# Patient Record
Sex: Female | Born: 1995
Health system: Southern US, Community
[De-identification: ages and names within clinical notes are randomized; demographics above are authoritative.]

## PROBLEM LIST (undated history)

## (undated) DIAGNOSIS — J302 Other seasonal allergic rhinitis: Secondary | ICD-10-CM

## (undated) DIAGNOSIS — L309 Dermatitis, unspecified: Secondary | ICD-10-CM

## (undated) DIAGNOSIS — T4145XA Adverse effect of unspecified anesthetic, initial encounter: Secondary | ICD-10-CM

## (undated) DIAGNOSIS — N926 Irregular menstruation, unspecified: Secondary | ICD-10-CM

## (undated) DIAGNOSIS — T8859XA Other complications of anesthesia, initial encounter: Secondary | ICD-10-CM

## (undated) DIAGNOSIS — J353 Hypertrophy of tonsils with hypertrophy of adenoids: Secondary | ICD-10-CM

## (undated) HISTORY — PX: MOUTH SURGERY: SHX715

---
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
## 2017-05-29 ENCOUNTER — Other Ambulatory Visit: Payer: Self-pay

## 2017-05-29 ENCOUNTER — Encounter (HOSPITAL_COMMUNITY): Payer: Self-pay

## 2017-05-29 ENCOUNTER — Emergency Department (HOSPITAL_COMMUNITY): Payer: BLUE CROSS/BLUE SHIELD

## 2017-05-29 ENCOUNTER — Emergency Department (HOSPITAL_COMMUNITY)
Admission: EM | Admit: 2017-05-29 | Discharge: 2017-05-29 | Disposition: A | Discharge status: Home / Self Care | Payer: BLUE CROSS/BLUE SHIELD | Attending: Emergency Medicine | Admitting: Emergency Medicine

## 2017-05-29 DIAGNOSIS — S301XXA Contusion of abdominal wall, initial encounter: Secondary | ICD-10-CM | POA: Insufficient documentation

## 2017-05-29 DIAGNOSIS — Z7722 Contact with and (suspected) exposure to environmental tobacco smoke (acute) (chronic): Secondary | ICD-10-CM | POA: Diagnosis not present

## 2017-05-29 DIAGNOSIS — T07XXXA Unspecified multiple injuries, initial encounter: Secondary | ICD-10-CM

## 2017-05-29 DIAGNOSIS — Y92410 Unspecified street and highway as the place of occurrence of the external cause: Secondary | ICD-10-CM | POA: Diagnosis not present

## 2017-05-29 DIAGNOSIS — I871 Compression of vein: Secondary | ICD-10-CM | POA: Diagnosis not present

## 2017-05-29 DIAGNOSIS — Y9389 Activity, other specified: Secondary | ICD-10-CM | POA: Insufficient documentation

## 2017-05-29 DIAGNOSIS — Y999 Unspecified external cause status: Secondary | ICD-10-CM | POA: Diagnosis not present

## 2017-05-29 DIAGNOSIS — J302 Other seasonal allergic rhinitis: Secondary | ICD-10-CM | POA: Insufficient documentation

## 2017-05-29 DIAGNOSIS — Z789 Other specified health status: Secondary | ICD-10-CM

## 2017-05-29 LAB — COMPREHENSIVE METABOLIC PANEL
ALK PHOS: 56 U/L (ref 38–126)
ALT: 13 U/L — AB (ref 14–54)
ANION GAP: 10 (ref 5–15)
AST: 23 U/L (ref 15–41)
Albumin: 4.1 g/dL (ref 3.5–5.0)
BUN: 13 mg/dL (ref 6–20)
CALCIUM: 9.6 mg/dL (ref 8.9–10.3)
CO2: 23 mmol/L (ref 22–32)
CREATININE: 0.77 mg/dL (ref 0.44–1.00)
Chloride: 107 mmol/L (ref 101–111)
Glucose, Bld: 105 mg/dL — ABNORMAL HIGH (ref 65–99)
Potassium: 3.9 mmol/L (ref 3.5–5.1)
Sodium: 140 mmol/L (ref 135–145)
Total Bilirubin: 0.9 mg/dL (ref 0.3–1.2)
Total Protein: 7.1 g/dL (ref 6.5–8.1)

## 2017-05-29 LAB — I-STAT BETA HCG BLOOD, ED (MC, WL, AP ONLY): I-stat hCG, quantitative: <5 m[IU]/mL (ref <5)

## 2017-05-29 LAB — URINALYSIS, ROUTINE W REFLEX MICROSCOPIC
BILIRUBIN URINE: NEGATIVE
GLUCOSE, UA: NEGATIVE mg/dL
KETONES UR: NEGATIVE mg/dL
NITRITE: NEGATIVE
PROTEIN: NEGATIVE mg/dL
Specific Gravity, Urine: >1.046 — ABNORMAL HIGH (ref 1.005–1.030)
pH: 5 (ref 5.0–8.0)

## 2017-05-29 LAB — CBC
HCT: 39.8 % (ref 36.0–46.0)
HEMOGLOBIN: 12.9 g/dL (ref 12.0–15.0)
MCH: 29.9 pg (ref 26.0–34.0)
MCHC: 32.4 g/dL (ref 30.0–36.0)
MCV: 92.1 fL (ref 78.0–100.0)
PLATELETS: 236 10*3/uL (ref 150–400)
RBC: 4.32 MIL/uL (ref 3.87–5.11)
RDW: 12 % (ref 11.5–15.5)
WBC: 11.9 10*3/uL — AB (ref 4.0–10.5)

## 2017-05-29 LAB — I-STAT CHEM 8, ED
BUN: 13 mg/dL (ref 6–20)
CALCIUM ION: 1.27 mmol/L (ref 1.15–1.40)
CHLORIDE: 106 mmol/L (ref 101–111)
CREATININE: 0.7 mg/dL (ref 0.44–1.00)
Glucose, Bld: 102 mg/dL — ABNORMAL HIGH (ref 65–99)
HCT: 40 % (ref 36.0–46.0)
HEMOGLOBIN: 13.6 g/dL (ref 12.0–15.0)
POTASSIUM: 3.8 mmol/L (ref 3.5–5.1)
Sodium: 142 mmol/L (ref 135–145)
TCO2: 23 mmol/L (ref 22–32)

## 2017-05-29 LAB — SAMPLE TO BLOOD BANK

## 2017-05-29 LAB — I-STAT CG4 LACTIC ACID, ED: Lactic Acid, Venous: 2.15 mmol/L (ref 0.5–1.9)

## 2017-05-29 MED ORDER — BACLOFEN 10 MG PO TABS: 10.0000 mg | ORAL_TABLET | Freq: Three times a day (TID) | ORAL | 0 refills | Status: DC

## 2017-05-29 MED ORDER — IOPAMIDOL (ISOVUE-300) INJECTION 61%
INTRAVENOUS | Status: AC
  Administered 2017-05-29: 100 mL
  Filled 2017-05-29: qty 100

## 2017-05-29 MED ORDER — SODIUM CHLORIDE 0.9 % IV BOLUS
1000.0000 mL | Freq: Once | INTRAVENOUS | Status: AC
  Administered 2017-05-29: 1000 mL via INTRAVENOUS

## 2017-05-29 MED ORDER — SODIUM CHLORIDE 0.9 % IV BOLUS
500.0000 mL | Freq: Once | INTRAVENOUS | Status: AC
  Administered 2017-05-29: 500 mL via INTRAVENOUS

## 2017-05-29 MED ORDER — MELOXICAM 15 MG PO TABS: 15.0000 mg | ORAL_TABLET | Freq: Every day | ORAL | 0 refills | Status: DC

## 2017-05-29 MED ORDER — TETANUS-DIPHTH-ACELL PERTUSSIS 5-2.5-18.5 LF-MCG/0.5 IM SUSP
0.5000 mL | Freq: Once | INTRAMUSCULAR | Status: AC
  Administered 2017-05-29: 0.5 mL via INTRAMUSCULAR
  Filled 2017-05-29: qty 0.5

## 2017-05-29 NOTE — ED Notes (Signed)
EDP at bedside  
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
## 2017-05-29 NOTE — ED Notes (Signed)
Patient transported to X-ray 
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
## 2017-05-29 NOTE — Discharge Instructions (Signed)
There was no blood in your urine today. Please follow up with vascular surgery. Return to the emergency department immediately if you develop any of the following symptoms: You have numbness, tingling, or weakness in the arms or legs. You develop severe headaches not relieved with medicine. You have severe neck pain, especially tenderness in the middle of the back of your neck. You have changes in bowel or bladder control. There is increasing pain in any area of the body. You have shortness of breath, light-headedness, dizziness, or fainting. You have chest pain. You feel sick to your stomach (nauseous), throw up (vomit), or sweat. You have increasing abdominal discomfort. There is blood in your urine, stool, or vomit. You have pain in your shoulder (shoulder strap areas). You feel your symptoms are getting worse.

## 2017-07-23 ENCOUNTER — Other Ambulatory Visit: Payer: Self-pay

## 2017-07-23 ENCOUNTER — Encounter: Payer: Self-pay | Admitting: Surgery

## 2017-07-23 ENCOUNTER — Ambulatory Visit: Payer: BLUE CROSS/BLUE SHIELD | Admitting: Surgery

## 2017-07-23 VITALS — BP 103/69 | HR 90 | Temp 98.0°F | Resp 14 | Ht 62.0 in | Wt 135.0 lb

## 2017-07-23 DIAGNOSIS — I871 Compression of vein: Secondary | ICD-10-CM | POA: Diagnosis not present
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
## 2017-08-13 ENCOUNTER — Other Ambulatory Visit: Payer: Self-pay

## 2017-08-13 DIAGNOSIS — I871 Compression of vein: Secondary | ICD-10-CM

## 2017-12-31 ENCOUNTER — Other Ambulatory Visit: Payer: Self-pay

## 2017-12-31 ENCOUNTER — Encounter (HOSPITAL_COMMUNITY): Payer: Self-pay | Admitting: *Deleted

## 2017-12-31 ENCOUNTER — Emergency Department (HOSPITAL_COMMUNITY)
Admission: EM | Admit: 2017-12-31 | Discharge: 2017-12-31 | Disposition: A | Discharge status: Home / Self Care | Payer: BLUE CROSS/BLUE SHIELD | Attending: Emergency Medicine | Admitting: Emergency Medicine

## 2017-12-31 DIAGNOSIS — Z5321 Procedure and treatment not carried out due to patient leaving prior to being seen by health care provider: Secondary | ICD-10-CM | POA: Diagnosis not present

## 2017-12-31 DIAGNOSIS — I871 Compression of vein: Secondary | ICD-10-CM

## 2017-12-31 LAB — URINALYSIS, ROUTINE W REFLEX MICROSCOPIC
Bacteria, UA: NONE SEEN
Bilirubin Urine: NEGATIVE
GLUCOSE, UA: NEGATIVE mg/dL
Ketones, ur: NEGATIVE mg/dL
Leukocytes, UA: NEGATIVE
NITRITE: NEGATIVE
PH: 7 (ref 5.0–8.0)
Protein, ur: NEGATIVE mg/dL
SPECIFIC GRAVITY, URINE: 1.003 — AB (ref 1.005–1.030)

## 2017-12-31 NOTE — ED Notes (Signed)
Pt left said she will get urine results later.

## 2017-12-31 NOTE — ED Triage Notes (Signed)
Pt reports being diagnosed with "nutcracker syndrome" and was sent to hospital for UA, to r/o blood in urine. No distress is noted.

## 2018-01-07 ENCOUNTER — Ambulatory Visit (HOSPITAL_COMMUNITY)
Admission: RE | Admit: 2018-01-07 | Discharge: 2018-01-07 | Disposition: A | Discharge status: Home / Self Care | Payer: BLUE CROSS/BLUE SHIELD | Source: Ambulatory Visit | Attending: Surgery | Admitting: Surgery

## 2018-01-07 ENCOUNTER — Ambulatory Visit: Payer: BLUE CROSS/BLUE SHIELD | Admitting: Surgery

## 2018-01-07 ENCOUNTER — Other Ambulatory Visit: Payer: Self-pay

## 2018-01-07 ENCOUNTER — Encounter: Payer: Self-pay | Admitting: Surgery

## 2018-01-07 VITALS — BP 92/63 | HR 70 | Temp 98.0°F | Resp 16 | Ht 62.0 in | Wt 127.0 lb

## 2018-01-07 DIAGNOSIS — I871 Compression of vein: Secondary | ICD-10-CM | POA: Diagnosis present
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
## 2018-02-06 DIAGNOSIS — I871 Compression of vein: Secondary | ICD-10-CM | POA: Insufficient documentation

## 2018-04-02 DIAGNOSIS — H02881 Meibomian gland dysfunction right upper eyelid: Secondary | ICD-10-CM | POA: Diagnosis not present

## 2018-05-06 DIAGNOSIS — Z30013 Encounter for initial prescription of injectable contraceptive: Secondary | ICD-10-CM | POA: Diagnosis not present

## 2018-07-31 DIAGNOSIS — Z3042 Encounter for surveillance of injectable contraceptive: Secondary | ICD-10-CM | POA: Diagnosis not present

## 2019-03-19 DIAGNOSIS — Z20822 Contact with and (suspected) exposure to covid-19: Secondary | ICD-10-CM | POA: Diagnosis not present

## 2019-03-21 ENCOUNTER — Ambulatory Visit: Payer: Medicaid Other | Attending: Internal Medicine

## 2019-03-21 ENCOUNTER — Other Ambulatory Visit: Payer: Self-pay

## 2019-03-21 DIAGNOSIS — Z20822 Contact with and (suspected) exposure to covid-19: Secondary | ICD-10-CM | POA: Diagnosis not present

## 2019-03-22 LAB — NOVEL CORONAVIRUS, NAA: SARS-CoV-2, NAA: NOT DETECTED

## 2019-03-24 ENCOUNTER — Other Ambulatory Visit: Payer: BLUE CROSS/BLUE SHIELD

## 2019-04-04 ENCOUNTER — Other Ambulatory Visit: Payer: Self-pay

## 2019-04-04 DIAGNOSIS — I871 Compression of vein: Secondary | ICD-10-CM

## 2019-04-07 ENCOUNTER — Other Ambulatory Visit: Payer: Self-pay

## 2019-04-07 ENCOUNTER — Ambulatory Visit (HOSPITAL_COMMUNITY)
Admission: RE | Admit: 2019-04-07 | Discharge: 2019-04-07 | Disposition: A | Discharge status: Home / Self Care | Payer: BC Managed Care – PPO | Source: Ambulatory Visit | Attending: Surgery | Admitting: Surgery

## 2019-04-07 ENCOUNTER — Ambulatory Visit (INDEPENDENT_AMBULATORY_CARE_PROVIDER_SITE_OTHER): Payer: BC Managed Care – PPO | Admitting: Surgery

## 2019-04-07 ENCOUNTER — Encounter: Payer: Self-pay | Admitting: Surgery

## 2019-04-07 VITALS — BP 97/65 | HR 65 | Temp 97.4°F | Resp 18 | Ht 62.0 in | Wt 137.0 lb

## 2019-04-07 DIAGNOSIS — I871 Compression of vein: Secondary | ICD-10-CM | POA: Diagnosis not present
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
## 2019-04-08 DIAGNOSIS — Z3042 Encounter for surveillance of injectable contraceptive: Secondary | ICD-10-CM | POA: Diagnosis not present

## 2019-04-11 DIAGNOSIS — I871 Compression of vein: Secondary | ICD-10-CM | POA: Diagnosis not present

## 2019-04-12 LAB — URINALYSIS
Bilirubin Urine: NEGATIVE
Glucose, UA: NEGATIVE
Hgb urine dipstick: NEGATIVE
Ketones, ur: NEGATIVE
Nitrite: NEGATIVE
Specific Gravity, Urine: 1.023 (ref 1.001–1.03)
pH: 6 (ref 5.0–8.0)

## 2019-04-15 NOTE — Telephone Encounter (Signed)
Sent information to Dr Myra Gianotti in staff message

## 2019-04-16 ENCOUNTER — Other Ambulatory Visit: Payer: Self-pay | Admitting: *Deleted

## 2019-04-16 DIAGNOSIS — I871 Compression of vein: Secondary | ICD-10-CM

## 2019-04-16 NOTE — Telephone Encounter (Signed)
Received staff message from Dr Myra Gianotti requesting we order repeat urine and CT scan for 1 month. Lab order placed Larita Fife will schedule CT and notify patient regarding CT appt and lab information.

## 2019-04-28 ENCOUNTER — Other Ambulatory Visit: Payer: Self-pay

## 2019-04-28 DIAGNOSIS — I871 Compression of vein: Secondary | ICD-10-CM

## 2019-05-06 ENCOUNTER — Telehealth: Payer: Self-pay

## 2019-05-06 ENCOUNTER — Other Ambulatory Visit: Payer: Self-pay

## 2019-05-06 DIAGNOSIS — I871 Compression of vein: Secondary | ICD-10-CM

## 2019-05-06 NOTE — Telephone Encounter (Addendum)
Per Dr. Myra Gianotti, we will cancel the CTA for now with a repeat UA before her follow up appt 3/15.  He will decide if she needs a CT at that time.  Ernst Spell., LPN   ----- Message from Nada Libman, MD sent at 05/04/2019  8:59 PM EST ----- Regarding: RE: Peer to Peer Review Contact: [phone]  I will call  ----- Message ----- From: Yolonda Kida, LPN Sent: 7/80/0447   3:22 PM EST To: Nada Libman, MD Subject: Peer to Peer Review                            Hey Dr. Myra Gianotti.  This case is requiring a Peer to Peer review for diagnostic code i87.1 (Nutcracker Syndrome) and CPT code 15806 (CTA Abdomen/Pelvis).  The Representative said you can call [phone] and give them Reference # [id number].  This case will close out and be denied on 05/06/2019 if there is not a response.    Please advise if you are willing to call and start a Peer review.  Thank you,  Ernst Spell., LPN

## 2019-05-06 NOTE — Telephone Encounter (Addendum)
Pt called back.  She will have the Urinalysis tomorrow morning 05/07/2019.  She will follow up with Dr. Myra Gianotti on 05/19/19.  Ernst Spell., LPN    I tried calling patient to make her aware we are cancelling her CTA and that she will need a urinalysis.  No answer.  Voicemail left with instructions to return phone call.   Ernst Spell., LPN

## 2019-05-06 NOTE — Progress Notes (Signed)
Pt called back.  She will have the Urinalysis tomorrow morning 05/07/2019.  She will follow up with Dr. Myra Gianotti on 05/19/19.  Ernst Spell., LPN

## 2019-05-07 ENCOUNTER — Encounter: Payer: Self-pay | Admitting: Surgery

## 2019-05-07 DIAGNOSIS — I871 Compression of vein: Secondary | ICD-10-CM | POA: Diagnosis not present

## 2019-05-08 LAB — URINALYSIS
Bilirubin Urine: NEGATIVE
Glucose, UA: NEGATIVE
Hgb urine dipstick: NEGATIVE
Ketones, ur: NEGATIVE
Leukocytes,Ua: NEGATIVE
Nitrite: NEGATIVE
Protein, ur: NEGATIVE
Specific Gravity, Urine: 1.022 (ref 1.001–1.03)
pH: 6 (ref 5.0–8.0)

## 2019-05-09 ENCOUNTER — Other Ambulatory Visit: Payer: BC Managed Care – PPO

## 2019-05-16 ENCOUNTER — Telehealth (HOSPITAL_COMMUNITY): Payer: Self-pay

## 2019-05-16 NOTE — Telephone Encounter (Signed)

## 2019-05-19 ENCOUNTER — Other Ambulatory Visit: Payer: Self-pay

## 2019-05-19 ENCOUNTER — Ambulatory Visit (INDEPENDENT_AMBULATORY_CARE_PROVIDER_SITE_OTHER): Payer: BC Managed Care – PPO | Admitting: Surgery

## 2019-05-19 ENCOUNTER — Encounter: Payer: Self-pay | Admitting: Surgery

## 2019-05-19 VITALS — BP 104/66 | HR 89 | Temp 97.4°F | Resp 20 | Ht 62.0 in | Wt 138.0 lb

## 2019-05-19 DIAGNOSIS — I871 Compression of vein: Secondary | ICD-10-CM | POA: Diagnosis not present
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
## 2019-06-04 DIAGNOSIS — I871 Compression of vein: Secondary | ICD-10-CM | POA: Diagnosis not present

## 2019-07-16 DIAGNOSIS — Z6825 Body mass index (BMI) 25.0-25.9, adult: Secondary | ICD-10-CM | POA: Diagnosis not present

## 2019-07-16 DIAGNOSIS — Z3042 Encounter for surveillance of injectable contraceptive: Secondary | ICD-10-CM | POA: Diagnosis not present

## 2019-07-16 DIAGNOSIS — Z01419 Encounter for gynecological examination (general) (routine) without abnormal findings: Secondary | ICD-10-CM | POA: Diagnosis not present

## 2019-09-20 DIAGNOSIS — R0789 Other chest pain: Secondary | ICD-10-CM | POA: Diagnosis not present

## 2019-09-20 DIAGNOSIS — R002 Palpitations: Secondary | ICD-10-CM | POA: Diagnosis not present

## 2019-10-07 DIAGNOSIS — Z3042 Encounter for surveillance of injectable contraceptive: Secondary | ICD-10-CM | POA: Diagnosis not present

## 2019-12-15 DIAGNOSIS — M545 Low back pain, unspecified: Secondary | ICD-10-CM | POA: Diagnosis not present

## 2019-12-15 DIAGNOSIS — Z03818 Encounter for observation for suspected exposure to other biological agents ruled out: Secondary | ICD-10-CM | POA: Diagnosis not present

## 2019-12-22 DIAGNOSIS — R450 Nervousness: Secondary | ICD-10-CM | POA: Diagnosis not present

## 2019-12-22 DIAGNOSIS — F419 Anxiety disorder, unspecified: Secondary | ICD-10-CM | POA: Diagnosis not present

## 2019-12-22 DIAGNOSIS — Z793 Long term (current) use of hormonal contraceptives: Secondary | ICD-10-CM | POA: Diagnosis not present

## 2019-12-22 DIAGNOSIS — Z91048 Other nonmedicinal substance allergy status: Secondary | ICD-10-CM | POA: Diagnosis not present

## 2019-12-22 DIAGNOSIS — R109 Unspecified abdominal pain: Secondary | ICD-10-CM | POA: Diagnosis not present

## 2019-12-23 DIAGNOSIS — Z3042 Encounter for surveillance of injectable contraceptive: Secondary | ICD-10-CM | POA: Diagnosis not present

## 2020-02-10 DIAGNOSIS — Z01419 Encounter for gynecological examination (general) (routine) without abnormal findings: Secondary | ICD-10-CM | POA: Diagnosis not present

## 2020-02-10 DIAGNOSIS — Z118 Encounter for screening for other infectious and parasitic diseases: Secondary | ICD-10-CM | POA: Diagnosis not present

## 2020-07-06 ENCOUNTER — Emergency Department (HOSPITAL_COMMUNITY): Payer: 59

## 2020-07-06 ENCOUNTER — Other Ambulatory Visit: Payer: Self-pay

## 2020-07-06 ENCOUNTER — Emergency Department (HOSPITAL_COMMUNITY)
Admission: EM | Admit: 2020-07-06 | Discharge: 2020-07-06 | Disposition: A | Discharge status: Home / Self Care | Payer: 59 | Attending: Emergency Medicine | Admitting: Emergency Medicine

## 2020-07-06 DIAGNOSIS — R630 Anorexia: Secondary | ICD-10-CM | POA: Insufficient documentation

## 2020-07-06 DIAGNOSIS — Z7722 Contact with and (suspected) exposure to environmental tobacco smoke (acute) (chronic): Secondary | ICD-10-CM | POA: Insufficient documentation

## 2020-07-06 DIAGNOSIS — R1032 Left lower quadrant pain: Secondary | ICD-10-CM

## 2020-07-06 DIAGNOSIS — R197 Diarrhea, unspecified: Secondary | ICD-10-CM | POA: Insufficient documentation

## 2020-07-06 DIAGNOSIS — R110 Nausea: Secondary | ICD-10-CM | POA: Insufficient documentation

## 2020-07-06 LAB — URINALYSIS, ROUTINE W REFLEX MICROSCOPIC
Bilirubin Urine: NEGATIVE
Glucose, UA: NEGATIVE mg/dL
Ketones, ur: 20 mg/dL — AB
Leukocytes,Ua: NEGATIVE
Nitrite: NEGATIVE
Protein, ur: NEGATIVE mg/dL
Specific Gravity, Urine: >1.046 — ABNORMAL HIGH (ref 1.005–1.030)
pH: 5 (ref 5.0–8.0)

## 2020-07-06 LAB — CBC
HCT: 43.4 % (ref 36.0–46.0)
Hemoglobin: 14.4 g/dL (ref 12.0–15.0)
MCH: 30.5 pg (ref 26.0–34.0)
MCHC: 33.2 g/dL (ref 30.0–36.0)
MCV: 91.9 fL (ref 80.0–100.0)
Platelets: 233 10*3/uL (ref 150–400)
RBC: 4.72 MIL/uL (ref 3.87–5.11)
RDW: 11.3 % — ABNORMAL LOW (ref 11.5–15.5)
WBC: 4.9 10*3/uL (ref 4.0–10.5)
nRBC: 0 % (ref 0.0–0.2)

## 2020-07-06 LAB — COMPREHENSIVE METABOLIC PANEL
ALT: 12 U/L (ref 0–44)
AST: 23 U/L (ref 15–41)
Albumin: 4 g/dL (ref 3.5–5.0)
Alkaline Phosphatase: 50 U/L (ref 38–126)
Anion gap: 10 (ref 5–15)
BUN: 10 mg/dL (ref 6–20)
CO2: 21 mmol/L — ABNORMAL LOW (ref 22–32)
Calcium: 9 mg/dL (ref 8.9–10.3)
Chloride: 105 mmol/L (ref 98–111)
Creatinine, Ser: 0.79 mg/dL (ref 0.44–1.00)
GFR, Estimated: >60 mL/min (ref >60)
Glucose, Bld: 91 mg/dL (ref 70–99)
Potassium: 3.4 mmol/L — ABNORMAL LOW (ref 3.5–5.1)
Sodium: 136 mmol/L (ref 135–145)
Total Bilirubin: 0.7 mg/dL (ref 0.3–1.2)
Total Protein: 7.2 g/dL (ref 6.5–8.1)

## 2020-07-06 LAB — I-STAT BETA HCG BLOOD, ED (MC, WL, AP ONLY): I-stat hCG, quantitative: <5 m[IU]/mL (ref <5)

## 2020-07-06 LAB — LIPASE, BLOOD: Lipase: 26 U/L (ref 11–51)

## 2020-07-06 MED ORDER — ACETAMINOPHEN 325 MG PO TABS
650.0000 mg | ORAL_TABLET | Freq: Once | ORAL | Status: AC
  Administered 2020-07-06: 650 mg via ORAL
  Filled 2020-07-06: qty 2

## 2020-07-06 MED ORDER — IOHEXOL 300 MG/ML  SOLN
75.0000 mL | Freq: Once | INTRAMUSCULAR | Status: AC | PRN
  Administered 2020-07-06: 75 mL via INTRAVENOUS

## 2020-07-06 MED ORDER — ONDANSETRON 4 MG PO TBDP: 4.0000 mg | ORAL_TABLET | Freq: Three times a day (TID) | ORAL | 0 refills | Status: DC | PRN

## 2020-07-06 MED ORDER — ONDANSETRON HCL 4 MG/2ML IJ SOLN
4.0000 mg | Freq: Once | INTRAMUSCULAR | Status: AC
  Administered 2020-07-06: 4 mg via INTRAVENOUS
  Filled 2020-07-06: qty 2

## 2020-07-06 MED ORDER — SODIUM CHLORIDE 0.9 % IV BOLUS
1000.0000 mL | Freq: Once | INTRAVENOUS | Status: AC
  Administered 2020-07-06: 1000 mL via INTRAVENOUS

## 2020-07-06 NOTE — ED Provider Notes (Signed)
[facility] EMERGENCY DEPARTMENT Provider Note   CSN: [id number] Arrival date & time: 07/06/20  1016     History Chief Complaint  Patient presents with  . Abdominal Pain    Sarah Pierce is a 25 y.o. female with past medical history of irregular menstrual cycles that presents the emergency department today for left lower quadrant pain since Saturday. States that this started after she ate some "pink uncooked chicken." Patient states that left lower quadrant pain is severe, has had this since Saturday, describes it as a dull achy sensation that is constant.  Does not radiate anywhere.  Patient also reports multiple episodes of diarrhea, about 10 a day, loose and watery, no blood.  Also reports decreased appetite and nausea, states that she has not eaten since Saturday..  Denies any fevers, vomiting, chest pain, shortness of breath.  Denies any bloody stools.  Denies any abdominal history.  Denies any vaginal complaints, urinary problems.  Denies any sick contacts.  States this is never happened to her before.  HPI     Past Medical History:  Diagnosis Date  . Complication of anesthesia    woke up crying and shaking  . Eczema   . Irregular periods/menstrual cycles   . Seasonal allergies   . Tonsillar and adenoid hypertrophy 07/2016    There are no problems to display for this patient.   Past Surgical History:  Procedure Laterality Date  . MOUTH SURGERY    . TONSILLECTOMY AND ADENOIDECTOMY Bilateral 07/11/2016   Procedure: TONSILLECTOMY AND ADENOIDECTOMY;  Surgeon: Newman Pies, MD;  Location: [facility];  Service: ENT;  Laterality: Bilateral;     OB History   No obstetric history on file.     No family history on file.  Social History   Tobacco Use  . Smoking status: Passive Smoke Exposure - Never Smoker  . Smokeless tobacco: Never Used  . Tobacco comment: parents smoke inside sometimes, mostly outside  Vaping Use  . Vaping Use: Never used   Substance Use Topics  . Alcohol use: No  . Drug use: No    Home Medications Prior to Admission medications   Medication Sig Start Date End Date Taking? Authorizing Provider  medroxyPROGESTERone (DEPO-PROVERA) 150 MG/ML injection Inject 150 mg into the muscle every 3 (three) months.   Yes [provider]    Allergies    Soap  Review of Systems   Review of Systems  Constitutional: Negative for chills, diaphoresis, fatigue and fever.  HENT: Negative for congestion, sore throat and trouble swallowing.   Eyes: Negative for pain and visual disturbance.  Respiratory: Negative for cough, shortness of breath and wheezing.   Cardiovascular: Negative for chest pain, palpitations and leg swelling.  Gastrointestinal: Positive for abdominal pain, diarrhea and nausea. Negative for abdominal distention and vomiting.  Genitourinary: Negative for difficulty urinating.  Musculoskeletal: Negative for back pain, neck pain and neck stiffness.  Skin: Negative for pallor.  Neurological: Negative for dizziness, speech difficulty, weakness and headaches.  Psychiatric/Behavioral: Negative for confusion.    Physical Exam Updated Vital Signs BP 99/75   Pulse 75   Temp 98.3 F (36.8 C) (Oral)   Resp 20   LMP 06/15/2020   SpO2 100%   Physical Exam Constitutional:      General: She is not in acute distress.    Appearance: Normal appearance. She is not ill-appearing, toxic-appearing or diaphoretic.  HENT:     Mouth/Throat:     Mouth: Mucous membranes are  moist.     Pharynx: Oropharynx is clear.  Eyes:     General: No scleral icterus.    Extraocular Movements: Extraocular movements intact.     Pupils: Pupils are equal, round, and reactive to light.  Cardiovascular:     Rate and Rhythm: Normal rate and regular rhythm.     Pulses: Normal pulses.     Heart sounds: Normal heart sounds.  Pulmonary:     Effort: Pulmonary effort is normal. No respiratory distress.     Breath sounds: Normal  breath sounds. No stridor. No wheezing, rhonchi or rales.  Chest:     Chest wall: No tenderness.  Abdominal:     General: Abdomen is flat. There is no distension.     Palpations: Abdomen is soft.     Tenderness: There is abdominal tenderness in the left lower quadrant. There is no guarding or rebound. Negative signs include Murphy's sign, Rovsing's sign, McBurney's sign and psoas sign.  Genitourinary:    Comments: Deferred  Musculoskeletal:        General: No swelling or tenderness. Normal range of motion.     Cervical back: Normal range of motion and neck supple. No rigidity.     Right lower leg: No edema.     Left lower leg: No edema.  Skin:    General: Skin is warm and dry.     Capillary Refill: Capillary refill takes less than 2 seconds.     Coloration: Skin is not pale.  Neurological:     General: No focal deficit present.     Mental Status: She is alert and oriented to person, place, and time.  Psychiatric:        Mood and Affect: Mood normal.        Behavior: Behavior normal.     ED Results / Procedures / Treatments   Labs (all labs ordered are listed, but only abnormal results are displayed) Labs Reviewed  COMPREHENSIVE METABOLIC PANEL - Abnormal; Notable for the following components:      Result Value   Potassium 3.4 (*)    CO2 21 (*)    All other components within normal limits  CBC - Abnormal; Notable for the following components:   RDW 11.3 (*)    All other components within normal limits  LIPASE, BLOOD  URINALYSIS, ROUTINE W REFLEX MICROSCOPIC  I-STAT BETA HCG BLOOD, ED ([facility], [facility], [facility] ONLY)    EKG None  Radiology CT Abdomen Pelvis W Contrast  Result Date: 07/06/2020 CLINICAL DATA:  Left lower quadrant abdominal pain. Patient reports nutcracker syndrome. EXAM: CT ABDOMEN AND PELVIS WITH CONTRAST TECHNIQUE: Multidetector CT imaging of the abdomen and pelvis was performed using the standard protocol following bolus administration of intravenous contrast.  CONTRAST:  49mL OMNIPAQUE IOHEXOL 300 MG/ML  SOLN COMPARISON:  05/29/2017 FINDINGS: Lower chest: No acute abnormality. Hepatobiliary: No focal liver abnormality is seen. No gallstones, gallbladder wall thickening, or biliary dilatation. Pancreas: Unremarkable. No pancreatic ductal dilatation or surrounding inflammatory changes. Spleen: Normal in size without focal abnormality. Adrenals/Urinary Tract: Normal appearance of the adrenal glands. No kidney mass or hydronephrosis. Urinary bladder is unremarkable. Stomach/Bowel: The stomach appears normal. The appendix is visualized and appears within normal limits. No bowel wall thickening, inflammation, or distension identified. Vascular/Lymphatic: Dilatation of the left renal vein which is narrowed as it crosses under the superior mesenteric artery. No abdominopelvic adenopathy identified. Reproductive: Uterus and the adnexal structures are unremarkable. Other: There is a small volume of free fluid within the  right posterior pelvis on the order of 2.7 x 2.5 x 3.1 cm (volume = 11 cm^3), image 71/3. no focal fluid collections identified within the abdomen or pelvis. Musculoskeletal: No acute or significant osseous findings. IMPRESSION: 1. No acute findings identified within the abdomen or pelvis. 2. Small volume of free fluid within the right posterior pelvis is identified. Etiology is indeterminate, but is may be physiologic in a premenopausal female. 3. Dilatation of the left renal vein which is narrowed as it crosses under the superior mesenteric artery. This is consistent with nutcracker syndrome. Electronically Signed   By: Signa Kell M.D.   On: 07/06/2020 14:11    Procedures Procedures   Medications Ordered in ED Medications  sodium chloride 0.9 % bolus 1,000 mL (0 mLs Intravenous Stopped 07/06/20 1457)  ondansetron (ZOFRAN) injection 4 mg (4 mg Intravenous Given 07/06/20 1302)  acetaminophen (TYLENOL) tablet 650 mg (650 mg Oral Given 07/06/20 1302)  iohexol  (OMNIPAQUE) 300 MG/ML solution 75 mL (75 mLs Intravenous Contrast Given 07/06/20 1340)    ED Course  I have reviewed the triage vital signs and the nursing notes.  Pertinent labs & imaging results that were available during my care of the patient were reviewed by me and considered in my medical decision making (see chart for details).    MDM Rules/Calculators/A&P                         Azelie Schnackenberg is a 25 y.o. female with past medical history of irregular menstrual cycles that presents the emergency department today for left lower quadrant pain.  Patient is not in any acute distress, differential to include colitis, however does have tenderness to left lower quadrant, referred from urgent care for CT scan.  Low suspicion for pelvic pathology with diarrhea and symptom duration.  Also offered pelvic exam and [place] patient deferred at this time.  Work-up today unremarkable, CBC and CMP unremarkable.  Urinalysis shoes hgb, pt states that this is normal for her due to Nutcracker syndrome. No signs of infection.   CT scan does not show any acute abdominal or pelvic findings.  Incidental findings discussed with patient including free fluid on the right side which does not correlate with exam or patient's pain. Pt will follow up with PCP. Patient is aware that she has nutcracker syndrome. Upon reevaluation patient states that she feels better, passed p.o. challenge.  Patient most likely has gastroenteritis, to be discharged and follow-up with PCP in the next couple of days, symptomatic treatment discussed. States that she feels better than she has in many days.   Doubt need for further emergent work up at this time. I explained the diagnosis and have given explicit precautions to return to the ER including for any other new or worsening symptoms. The patient understands and accepts the medical plan as it's been dictated and I have answered their questions. Discharge instructions concerning home care and  prescriptions have been given. The patient is STABLE and is discharged to home in good condition.  Final Clinical Impression(s) / ED Diagnoses Final diagnoses:  Left lower quadrant abdominal pain    Rx / DC Orders ED Discharge Orders    None       Farrel Gordon, PA-C 07/06/20 1701    Little, Ambrose Finland, MD 07/09/20 [phone]

## 2020-07-06 NOTE — ED Notes (Signed)
Patient transported to CT 

## 2020-07-06 NOTE — Discharge Instructions (Signed)
  You were evaluated in the Emergency Department and after careful evaluation, we did not find any emergent condition requiring admission or further testing in the hospital.   Your exam/testing today was overall reassuring.  Symptoms seem to be due to food poisoning.  Please make sure to stay hydrated, follow-up with your PCP in the next couple of days and also use the attached instructions. Please return to the Emergency Department if you experience any worsening of your condition.  Thank you for allowing [place] to be a part of your care. Please speak to your pharmacist about any new medications prescribed today in regards to side effects or interactions with other medications.  CT below, please help with your PCP in regards to the incidental findings discussed.  IMPRESSION:  1. No acute findings identified within the abdomen or pelvis.  2. Small volume of free fluid within the right posterior pelvis is  identified. Etiology is indeterminate, but is may be physiologic in  a premenopausal female.  3. Dilatation of the left renal vein which is narrowed as it crosses  under the superior mesenteric artery. This is consistent with  nutcracker syndrome.

## 2020-07-06 NOTE — ED Triage Notes (Signed)
Pt reports since Saturday she started having abd pain.Pt reports LLQ. Pt reports decrease appetite. Pt reports diarrhea. Pt denies any sob,cp

## 2021-04-22 ENCOUNTER — Encounter: Payer: Self-pay | Admitting: Physician Assistant

## 2021-05-05 ENCOUNTER — Other Ambulatory Visit: Payer: 59

## 2021-05-05 ENCOUNTER — Ambulatory Visit (INDEPENDENT_AMBULATORY_CARE_PROVIDER_SITE_OTHER): Payer: 59 | Admitting: Physician Assistant

## 2021-05-05 ENCOUNTER — Encounter: Payer: Self-pay | Admitting: Physician Assistant

## 2021-05-05 ENCOUNTER — Other Ambulatory Visit (INDEPENDENT_AMBULATORY_CARE_PROVIDER_SITE_OTHER): Payer: 59

## 2021-05-05 VITALS — BP 122/84 | HR 103 | Ht 62.0 in | Wt 161.0 lb

## 2021-05-05 DIAGNOSIS — K5900 Constipation, unspecified: Secondary | ICD-10-CM

## 2021-05-05 DIAGNOSIS — R140 Abdominal distension (gaseous): Secondary | ICD-10-CM

## 2021-05-05 LAB — CBC WITH DIFFERENTIAL/PLATELET
Basophils Absolute: 0 10*3/uL (ref 0.0–0.1)
Basophils Relative: 0.3 % (ref 0.0–3.0)
Eosinophils Absolute: 0.1 10*3/uL (ref 0.0–0.7)
Eosinophils Relative: 1.8 % (ref 0.0–5.0)
HCT: 40.7 % (ref 36.0–46.0)
Hemoglobin: 13.9 g/dL (ref 12.0–15.0)
Lymphocytes Relative: 43.1 % (ref 12.0–46.0)
Lymphs Abs: 2.9 10*3/uL (ref 0.7–4.0)
MCHC: 34.1 g/dL (ref 30.0–36.0)
MCV: 90.7 fl (ref 78.0–100.0)
Monocytes Absolute: 0.4 10*3/uL (ref 0.1–1.0)
Monocytes Relative: 6.4 % (ref 3.0–12.0)
Neutro Abs: 3.3 10*3/uL (ref 1.4–7.7)
Neutrophils Relative %: 48.4 % (ref 43.0–77.0)
Platelets: 243 10*3/uL (ref 150.0–400.0)
RBC: 4.49 Mil/uL (ref 3.87–5.11)
RDW: 12 % (ref 11.5–15.5)
WBC: 6.7 10*3/uL (ref 4.0–10.5)

## 2021-05-05 LAB — SEDIMENTATION RATE: Sed Rate: 10 mm/hr (ref 0–20)

## 2021-05-05 LAB — C-REACTIVE PROTEIN: CRP: <1 mg/dL (ref 0.5–20.0)

## 2021-05-05 NOTE — Progress Notes (Signed)
? ?Subjective:  ? ? Patient ID: Sarah Pierce, female    DOB: January 08, 1996, 26 y.o.   MRN: [id number] ? ?HPI ?Genecis is a pleasant 26 year old white female, new to GI today, referred by Dr. Bufford Lope for evaluation of constipation, abdominal bloating and gas. ?Patient says that she had not had any GI problems and always had regular bowel movements until she contracted COVID-19 in July 2022.  She did not have any GI symptoms with the illness itself but afterwards developed abdominal cramping and other GI symptoms which have persisted. ?She has had pelvic ultrasounds which are unremarkable.  She says she is mostly having issues with constipation and has gone as long as 9 days without a bowel movement.  She is not currently taking any over-the-counter laxatives or other remedies for her GI symptoms.  She says her work schedule is irregular which makes it difficult for her to know how she would do if she takes a laxative.  No melena or hematochezia.  She has gained about 30 pounds for unclear reasons, does not feel that she has had significant change in activity or her diet.  She did have a recent TSH done which she says was normal.  Appetite has been fine, no complaints of heartburn or indigestion, no nausea or vomiting.  Occasionally she will have an episode of diarrhea and may have several diarrheal stools in 1 day and then goes back to being constipated. ? ?She did have CT of the abdomen and pelvis in May 2022 which was negative.  Specifically no gallstones, she was noted to have some dilation of the left renal vein as it crosses the SMA.  Otherwise negative study. ? ?Family history is negative for GI disease as far she is aware. ? ?Review of Systems Pertinent positive and negative review of systems were noted in the above HPI section.  All other review of systems was otherwise negative.  ? ?Outpatient Encounter Medications as of 05/05/2021  ?Medication Sig  ? medroxyPROGESTERone (DEPO-PROVERA) 150 MG/ML injection  Inject 150 mg into the muscle every 3 (three) months.  ? [DISCONTINUED] ondansetron (ZOFRAN ODT) 4 MG disintegrating tablet Take 1 tablet (4 mg total) by mouth every 8 (eight) hours as needed for nausea or vomiting.  ? ?No facility-administered encounter medications on file as of 05/05/2021.  ? ?Allergies  ?Allergen Reactions  ? Soap Rash and Other (See Comments)  ?  EXACERBATES ECZEMA ?  ? ?There are no problems to display for this patient. ? ?Social History  ? ?Socioeconomic History  ? Marital status: Single  ?  Spouse name: Not on file  ? Number of children: Not on file  ? Years of education: Not on file  ? Highest education level: Not on file  ?Occupational History  ? Not on file  ?Tobacco Use  ? Smoking status: Never  ?  Passive exposure: Yes  ? Smokeless tobacco: Never  ? Tobacco comments:  ?  parents smoke inside sometimes, mostly outside  ?Vaping Use  ? Vaping Use: Never used  ?Substance and Sexual Activity  ? Alcohol use: Yes  ?  Comment: occasional  ? Drug use: No  ? Sexual activity: Not Currently  ?Other Topics Concern  ? Not on file  ?Social History Narrative  ? Not on file  ? ?Social Determinants of Health  ? ?Financial Resource Strain: Not on file  ?Food Insecurity: Not on file  ?Transportation Needs: Not on file  ?Physical Activity: Not on file  ?Stress: Not on file  ?  Social Connections: Not on file  ?Intimate Partner Violence: Not on file  ? ? ?Ms. Mixson's family history includes Irritable bowel syndrome in her mother and paternal grandmother; Kidney cancer in her father. ? ? ?   ?Objective:  ?  ?Vitals:  ? 05/05/21 1039  ?BP: 122/84  ?Pulse: (!) 103  ?SpO2: 98%  ? ? ?Physical Exam Well-developed well-nourished young white female in no acute distress.  Height, Weight, 161 BMI 29.4 ? ?HEENT; nontraumatic normocephalic, EOMI, PE R LA, sclera anicteric. ?Oropharynx; not examined today ?Neck; supple, no JVD ?Cardiovascular; regular rate and rhythm with S1-S2, no murmur rub or gallop ?Pulmonary; Clear  bilaterally ?Abdomen; soft, nontender, nondistended, no palpable mass or hepatosplenomegaly, bowel sounds are active ?Rectal; not done today ?Skin; benign exam, no jaundice rash or appreciable lesions ?Extremities; no clubbing cyanosis or edema skin warm and dry ?Neuro/Psych; alert and oriented x4, grossly nonfocal mood and affect appropriate  ? ? ? ?   ?Assessment & Plan:  ? ?#54 26 year old white female with 33-month history of constipation, abdominal bloating and gas, occasional discomfort.  Symptom onset after she had COVID-19 in July 2022.  She had not had any GI issues prior to that time. ? ?Obstipation has been significant at times going as long as 9 days between bowel movements, she is also having an occasional episode of diarrhea. ? ?CT of the abdomen pelvis May 2022 unremarkable for any acute GI abnormality, no inflammatory process, no gallstones ? ?Symptoms are most consistent with postinfectious IBS type picture after COVID-19 infection. ?Doubt IBD but consider, rule out celiac disease ? ?Plan; Check sed rate,, CRP, TTG and IgA ?Patient has been drinking quite a bit of water every day, encouraged to continue drinking at least 60 ounces of water per day ?Start high-fiber diet ?Start trial of MiraLAX 17 g in 8 ounces of water every day.  I advised her to try this over the next couple of weeks.  If she does not find MiraLAX effective she will call back and speak to my nurse and at that time would plan to start trial of Linzess. ?Patient will be established with Dr. Candis Schatz ? ?Ommie Degeorge S Deuntae Kocsis PA-C ?05/05/2021 ? ? ?Cc: Jettie Booze, NP ?  ?

## 2021-05-05 NOTE — Patient Instructions (Addendum)
If you are age 24 or younger, your body mass index should be between 19-25. Your Body mass index is 29.45 kg/m?Marland Kitchen If this is out of the aformentioned range listed, please consider follow up with your Primary Care Provider.  ?________________________________________________________ ? ?The [facility] providers would like to encourage you to use [facility] to communicate with providers for non-urgent requests or questions.  Due to long hold times on the telephone, sending your provider a message by [facility] may be a faster and more efficient way to get a response.  Please allow 48 business hours for a response.  Please remember that this is for non-urgent requests.  ?_______________________________________________________ ? ?Your provider has requested that you go to the basement level for lab work before leaving today. Press "B" on the elevator. The lab is located at the first door on the left as you exit the elevator. ? ?START Miralax 1 capful in 8 ounces of water or juice. ? ?Continue drinking at least 60 ounces of water daily ? ?Call the office and speak with Amy's nurse, Beth in 2 weeks if Miralax is not working well. ? ?Thank you for entrusting me with your care and choosing [facility]. ? ?Mike Gip, PA-C ?

## 2021-05-06 LAB — TISSUE TRANSGLUTAMINASE, IGA: (tTG) Ab, IgA: <1 U/mL

## 2021-05-06 LAB — IGA: Immunoglobulin A: 227 mg/dL (ref 47–310)

## 2021-05-09 NOTE — Progress Notes (Signed)
Agree with the assessment and plan as outlined by Amy Esterwood, PA-C.  Yafet Cline E. Taylor Spilde, MD  

## 2021-12-04 IMAGING — CT CT ABD-PELV W/ CM
2 of 4 series · 15 of 46 positions shown, 17 images · IV contrast (APPLIED)
Comparison: 05/29/2017

CLINICAL DATA: Left lower quadrant abdominal pain. Patient reports
nutcracker syndrome.

EXAM:
CT ABDOMEN AND PELVIS WITH CONTRAST
TECHNIQUE: Multidetector CT imaging of the abdomen and pelvis was performed
using the standard protocol following bolus administration of
intravenous contrast.
CONTRAST:  75mL OMNIPAQUE IOHEXOL 300 MG/ML  SOLN

[Series 3: abdomen 5.0 · axial · 0.67mm/px · z∈[+775,+1180]mm · 12 of 93 slices shown, 14 images]
[im 6/93  soft-tissue]
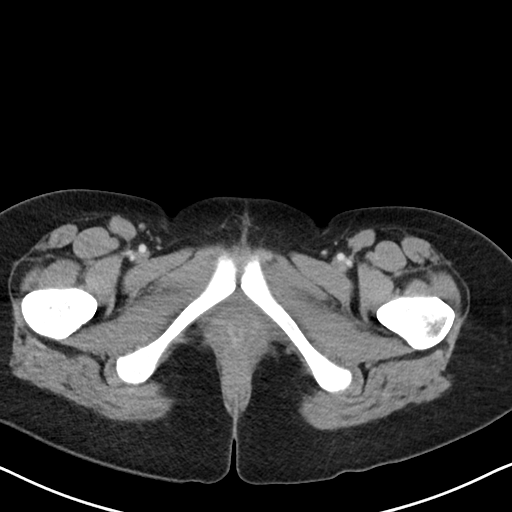
[im 6/93  bone]
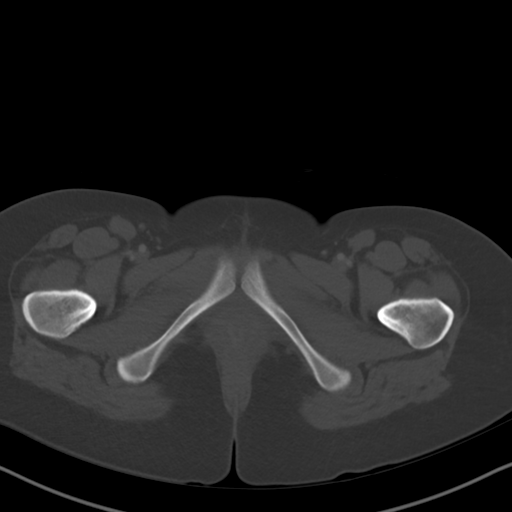
[im 16/93  soft-tissue]
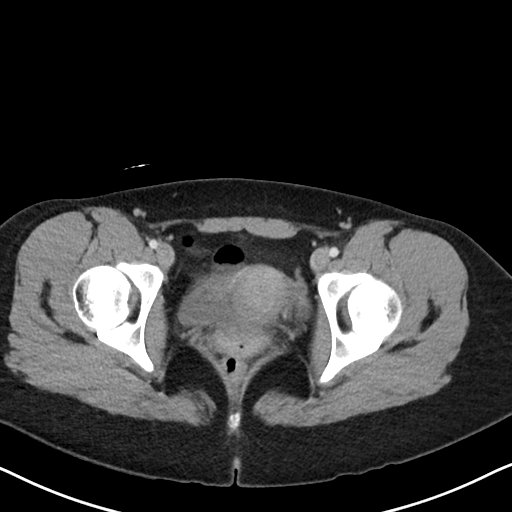
[im 21/93  soft-tissue]
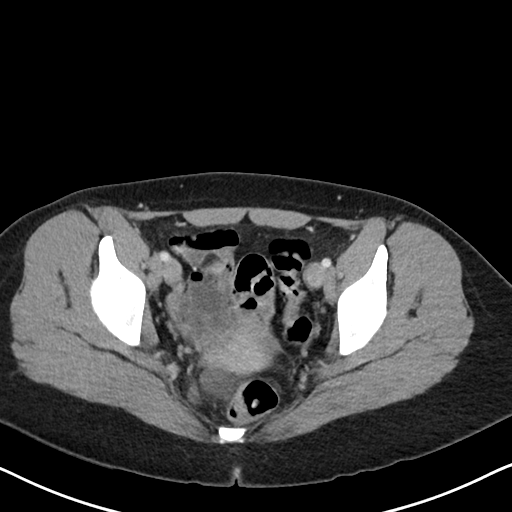
[im 26/93  soft-tissue]
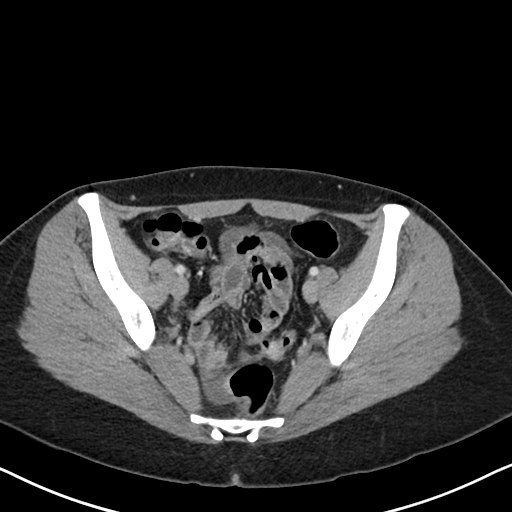
[im 36/93  soft-tissue]
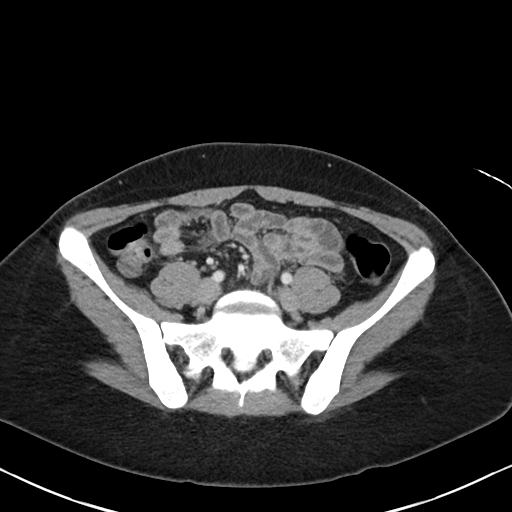
[im 41/93  soft-tissue]
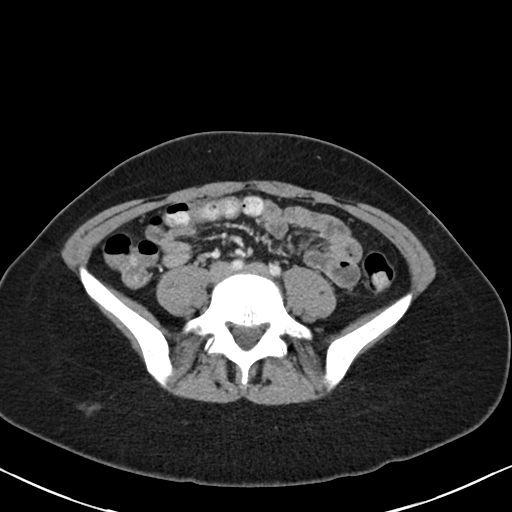
[im 52/93  soft-tissue]
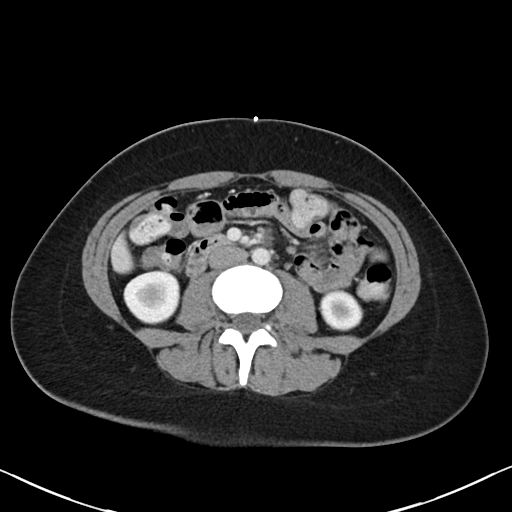
[im 57/93  soft-tissue]
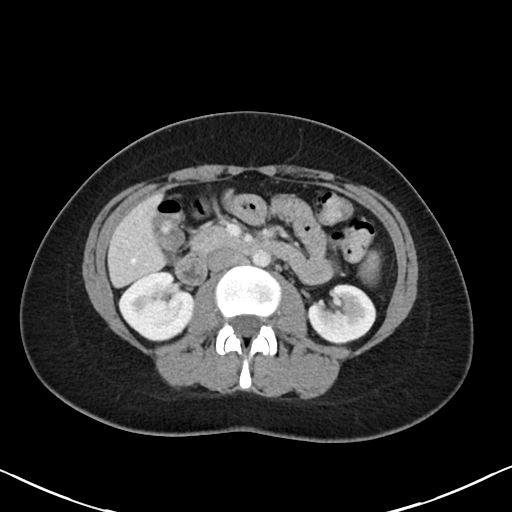
[im 67/93  soft-tissue]
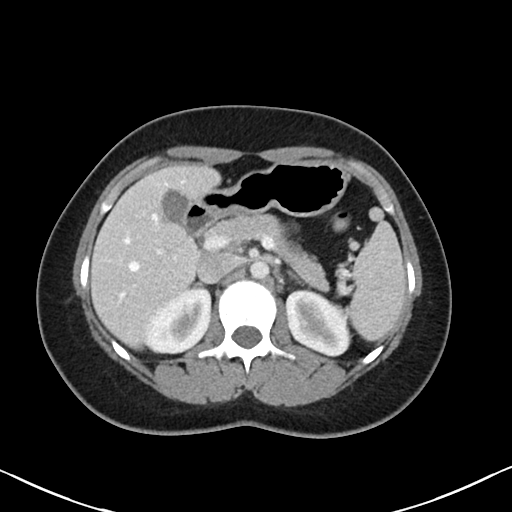
[im 67/93  bone]
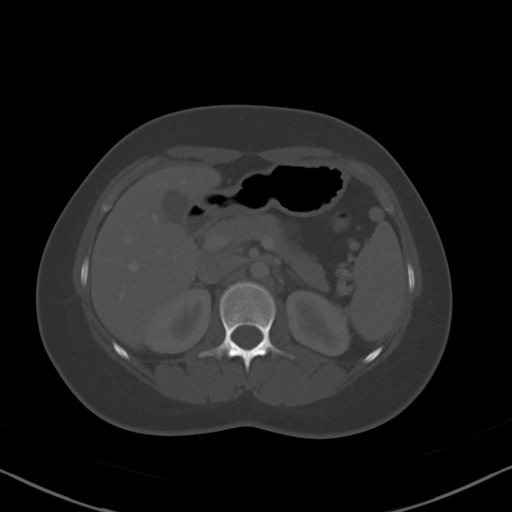
[im 72/93  soft-tissue]
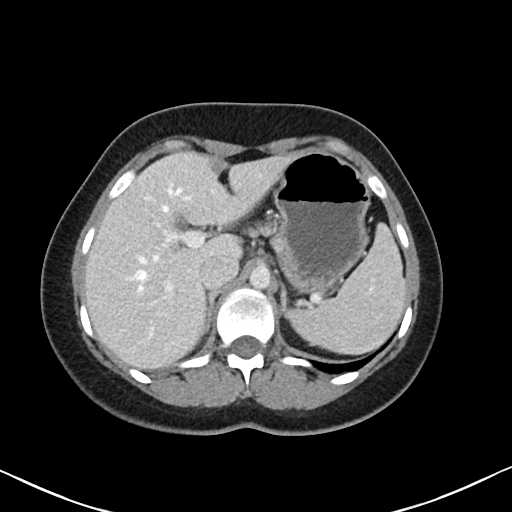
[im 77/93  soft-tissue]
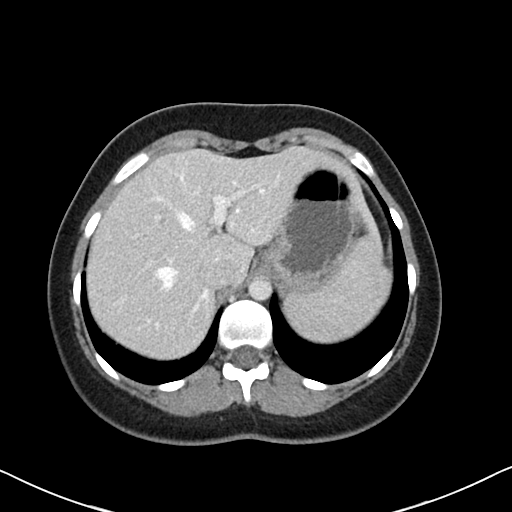
[im 87/93  soft-tissue]
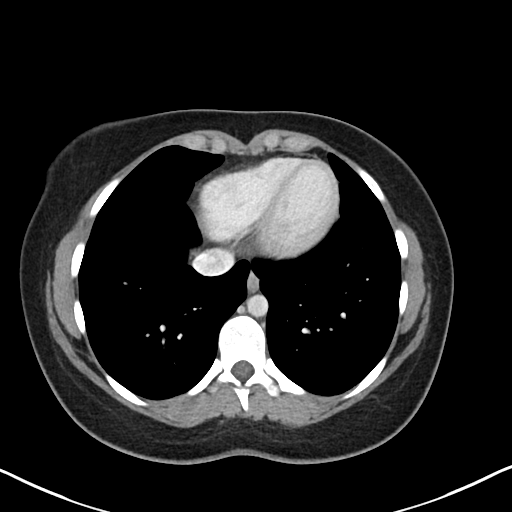

[Series 6: abdomen 3.0 mpr cor · coronal · 0.63mm/px · 3 of 88 slices shown]
[im 30/88  soft-tissue]
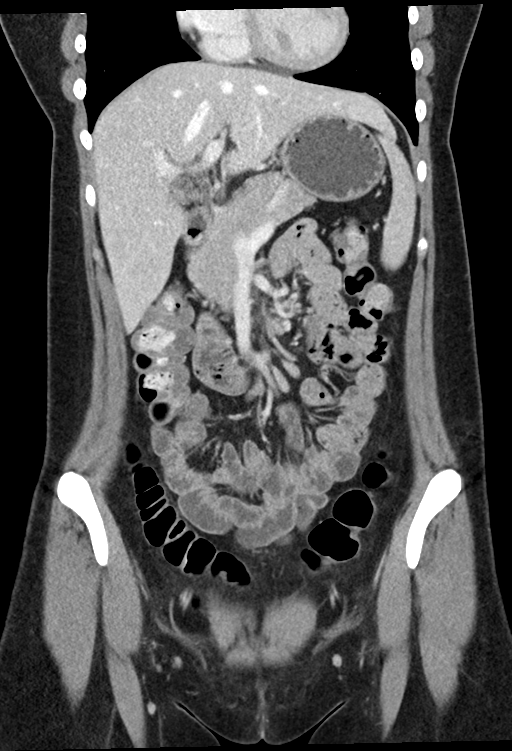
[im 39/88  soft-tissue]
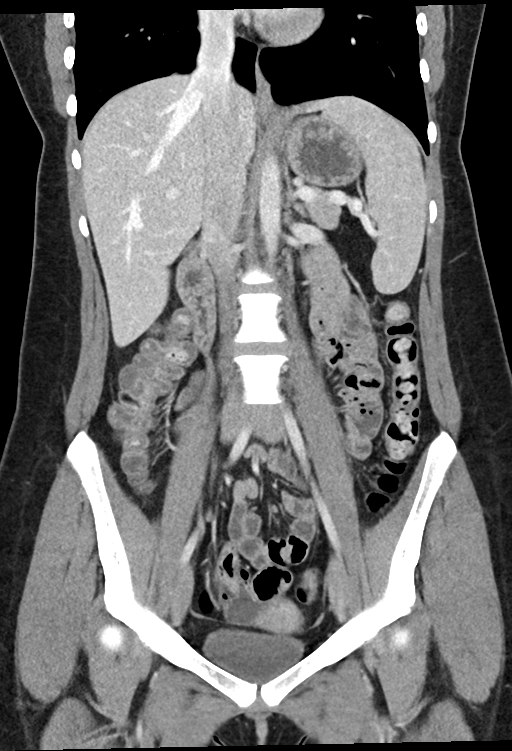
[im 49/88  soft-tissue]
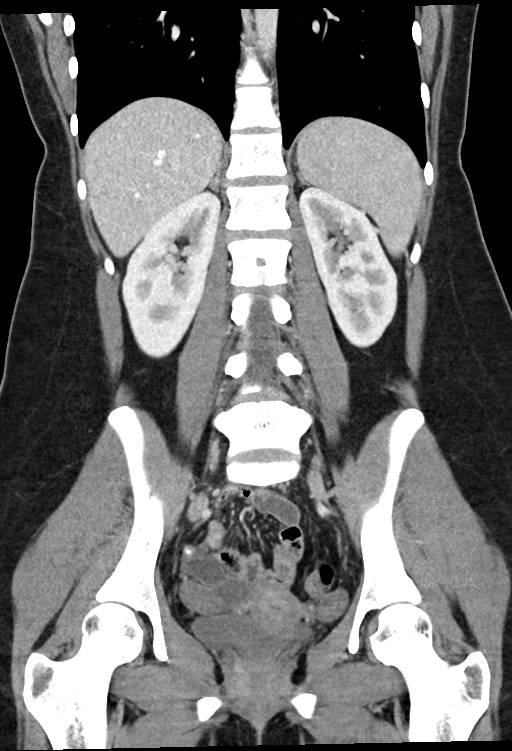

[15 of 46 positions shown; findings below may reference images not displayed]

FINDINGS: Lower chest: No acute abnormality.

Hepatobiliary: No focal liver abnormality is seen. No gallstones,
gallbladder wall thickening, or biliary dilatation.

Pancreas: Unremarkable. No pancreatic ductal dilatation or
surrounding inflammatory changes.

Spleen: Normal in size without focal abnormality.

Adrenals/Urinary Tract: Normal appearance of the adrenal glands. No
kidney mass or hydronephrosis. Urinary bladder is unremarkable.

Stomach/Bowel: The stomach appears normal. The appendix is
visualized and appears within normal limits. No bowel wall
thickening, inflammation, or distension identified.

Vascular/Lymphatic: Dilatation of the left renal vein which is
narrowed as it crosses under the superior mesenteric artery. No
abdominopelvic adenopathy identified.

Reproductive: Uterus and the adnexal structures are unremarkable.

Other: There is a small volume of free fluid within the right
posterior pelvis on the order of 2.7 x 2.5 x 3.1 cm (volume = 11
cm^3), image 71/3. no focal fluid collections identified within
the abdomen or pelvis.

Musculoskeletal: No acute or significant osseous findings.
IMPRESSION: 1. No acute findings identified within the abdomen or pelvis.
2. Small volume of free fluid within the right posterior pelvis is
identified. Etiology is indeterminate, but is may be physiologic in
a premenopausal female.
3. Dilatation of the left renal vein which is narrowed as it crosses
under the superior mesenteric artery. This is consistent with
nutcracker syndrome.

## 2022-12-05 ENCOUNTER — Encounter (HOSPITAL_BASED_OUTPATIENT_CLINIC_OR_DEPARTMENT_OTHER): Payer: Self-pay | Admitting: Emergency Medicine

## 2022-12-05 ENCOUNTER — Other Ambulatory Visit: Payer: Self-pay

## 2022-12-05 ENCOUNTER — Emergency Department (HOSPITAL_BASED_OUTPATIENT_CLINIC_OR_DEPARTMENT_OTHER): Payer: 59

## 2022-12-05 ENCOUNTER — Emergency Department (HOSPITAL_BASED_OUTPATIENT_CLINIC_OR_DEPARTMENT_OTHER)
Admission: EM | Admit: 2022-12-05 | Discharge: 2022-12-05 | Disposition: A | Discharge status: Home / Self Care | Payer: Worker's Compensation | Attending: Emergency Medicine | Admitting: Emergency Medicine

## 2022-12-05 DIAGNOSIS — W319XXA Contact with unspecified machinery, initial encounter: Secondary | ICD-10-CM | POA: Insufficient documentation

## 2022-12-05 DIAGNOSIS — S60221A Contusion of right hand, initial encounter: Secondary | ICD-10-CM | POA: Diagnosis present

## 2022-12-05 DIAGNOSIS — Y990 Civilian activity done for income or pay: Secondary | ICD-10-CM | POA: Diagnosis not present

## 2022-12-05 NOTE — ED Triage Notes (Signed)
Works as a Location manager ,right hand injury , index, middle and ring fingers smashed , present with ice back wrapped right hand . No bleeding she said .

## 2022-12-05 NOTE — ED Provider Notes (Signed)
[place] [facility] AT [facility] Provider Note   CSN: [id number] Arrival date & time: 12/05/22  1421     History  Chief Complaint  Patient presents with   Hand Injury    right    Helia Haese is a 27 y.o. female. Presenting for evaluation of a hand injury. 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 She reports her right second through fourth fingers were crushed. She had immediate pain to the affected fingers. Reports a tinging sensation. Denies difficulty moving the hand or fingers. Tetanus vaccine is up to date. Reports 7 out of 10 pain. No meds prior to arrival.    Hand Injury      Home Medications Prior to Admission medications   Medication Sig Start Date End Date Taking? Authorizing Provider  medroxyPROGESTERone (DEPO-PROVERA) 150 MG/ML injection Inject 150 mg into the muscle every 3 (three) months.    [provider]      Allergies    Soap    Review of Systems   Review of Systems  Musculoskeletal:  Positive for arthralgias.  All other systems reviewed and are negative.   Physical Exam Updated Vital Signs BP 126/75 (BP Location: Left Arm)   Pulse 100   Temp 99.1 F (37.3 C)   Resp 18   Wt 70.3 kg   SpO2 96%   BMI 28.35 kg/m  Physical Exam Vitals and nursing note reviewed.  Constitutional:      General: She is not in acute distress.    Appearance: Normal appearance. She is normal weight. She is not ill-appearing.  HENT:     Head: Normocephalic and atraumatic.  Pulmonary:     Effort: Pulmonary effort is normal. No respiratory distress.  Abdominal:     General: Abdomen is flat.  Musculoskeletal:        General: Normal range of motion.     Cervical back: Neck supple.     Comments: Full AROM of all digits of right hand. Capillary refill normal. Sensation intact. Nail plates intact. Grip strength 5/5. Small abrasion to palmar surface of 3rd digit overlying  proximal phalanx without active bleeding  Skin:    General: Skin is warm and dry.  Neurological:     Mental Status: She is alert and oriented to person, place, and time.  Psychiatric:        Mood and Affect: Mood normal.        Behavior: Behavior normal.     ED Results / Procedures / Treatments   Labs (all labs ordered are listed, but only abnormal results are displayed) Labs Reviewed - No data to display  EKG None  Radiology DG Hand Complete Right  Result Date: 12/05/2022 CLINICAL DATA:  Injury, fingers got stuck in machine. Pain primarily in the middle finger. EXAM: RIGHT HAND - COMPLETE 3+ VIEW COMPARISON:  None Available. FINDINGS: There is no evidence of fracture or dislocation. There is no evidence of arthropathy or other focal bone abnormality. Soft tissues are unremarkable. No radiopaque foreign body or soft tissue gas. IMPRESSION: Negative radiographs of the right hand. Electronically Signed   By: Narda Rutherford M.D.   On: 12/05/2022 15:57    Procedures Procedures    Medications Ordered in ED Medications - No data to display  ED Course/ Medical Decision Making/ A&P  Medical Decision Making Amount and/or Complexity of Data Reviewed Radiology: ordered.   This patient presents to the ED for concern of finger injury, this involves an extensive number of treatment options, and is a complaint that carries with it a high risk of complications and morbidity.  The differential diagnosis includes fracture, contusion, sprain  My initial workup includes imaging. Patient declined pain medication  Additional history obtained from: Nursing notes from this visit.  I ordered imaging studies including DG right hand I independently visualized and interpreted imaging which showed negative I agree with the radiologist interpretation  Afebrile, hemodynamically stable. 27 year old female presenting for right hand pain after suffering a crush  injury prior to arrival. Neurovascular status intact. Small amount of bruising of the 2nd and 3rd digits with abrasion of the 3rd digit. Imaging negative for fracture. Likely a contusion. She was given finger splints for comfort. She was encouraged to establish care and follow up within the next week. Stable at discharge.  At this time there does not appear to be any evidence of an acute emergency medical condition and the patient appears stable for discharge with appropriate outpatient follow up. Diagnosis was discussed with patient who verbalizes understanding of care plan and is agreeable to discharge. I have discussed return precautions with patient who verbalizes understanding. Patient encouraged to follow-up with their PCP within 1 week. All questions answered.  Note: Portions of this report may have been transcribed using voice recognition software. Every effort was made to ensure accuracy; however, inadvertent computerized transcription errors may still be present.        Final Clinical Impression(s) / ED Diagnoses Final diagnoses:  Contusion of right hand, initial encounter    Rx / DC Orders ED Discharge Orders     None         Michelle Piper, Cordelia Poche 12/05/22 1610    Alvira Monday, MD 12/06/22 1311

## 2022-12-05 NOTE — Discharge Instructions (Signed)
You have been seen today for your complaint of hand contusion Your imaging was negative. Your discharge medications include Alternate tylenol and ibuprofen for pain. You may alternate these every 4 hours. You may take up to 800 mg of ibuprofen at a time and up to 1000 mg of tylenol. Follow up with: a PCP of your choice within the next week for reevaluation Please seek immediate medical care if you develop any of the following symptoms: You have severe pain. Your hand or fingers become numb. Your hand or fingers turn pale, blue, or cold. You cannot move your hand or wrist. Your hand is warm to the touch. At this time there does not appear to be the presence of an emergent medical condition, however there is always the potential for conditions to change. Please read and follow the below instructions.  Do not take your medicine if  develop an itchy rash, swelling in your mouth or lips, or difficulty breathing; call 911 and seek immediate emergency medical attention if this occurs.  You may review your lab tests and imaging results in their entirety on your MyChart account.  Please discuss all results of fully with your primary care provider and other specialist at your follow-up visit.  Note: Portions of this text may have been transcribed using voice recognition software. Every effort was made to ensure accuracy; however, inadvertent computerized transcription errors may still be present.

## 2023-01-05 ENCOUNTER — Ambulatory Visit: Payer: 59 | Admitting: Family Medicine

## 2023-11-28 ENCOUNTER — Ambulatory Visit: Admitting: Family Medicine

## 2024-02-28 ENCOUNTER — Other Ambulatory Visit: Payer: Self-pay | Admitting: Medical Genetics

## 2024-03-05 ENCOUNTER — Other Ambulatory Visit (HOSPITAL_COMMUNITY)

## 2024-03-05 ENCOUNTER — Ambulatory Visit (HOSPITAL_BASED_OUTPATIENT_CLINIC_OR_DEPARTMENT_OTHER)

## 2024-03-05 ENCOUNTER — Encounter (HOSPITAL_BASED_OUTPATIENT_CLINIC_OR_DEPARTMENT_OTHER): Payer: Self-pay

## 2024-03-05 VITALS — BP 116/75 | HR 72 | Ht 62.0 in | Wt 175.6 lb

## 2024-03-05 DIAGNOSIS — Z124 Encounter for screening for malignant neoplasm of cervix: Secondary | ICD-10-CM | POA: Diagnosis not present

## 2024-03-05 DIAGNOSIS — Z7689 Persons encountering health services in other specified circumstances: Secondary | ICD-10-CM | POA: Diagnosis not present

## 2024-03-05 NOTE — Progress Notes (Signed)
 "   New Patient Office Visit  Subjective:   Sarah Pierce [birth year] 03/05/2024  Chief Complaint  Patient presents with   New Patient (Initial Visit)    Patient is here to get established with PCP. Wants to get a full evaluation. Denies any concerns for today's visit.     HPI: [name] presents today to establish care at [facility] Care and [facility] Medicine at [facility]. Introduced to publishing rights manager role and practice setting with verbalized understanding by patient.  All questions answered.   Last PCP: unsure Last annual physical: unsure Concerns: See below   Patient is wanting to get established and get a full physical with lab work. State she recently has had family members diagnosed with strokes, diabetes, and HTN and wants to make sure she gets checked out. Patient requesting a referral for an OBGYN as her current one is retired and she is due for an exam.   The following portions of the patient's history were reviewed and updated as appropriate: past medical history, past surgical history, family history, social history, allergies, medications, and problem list.   Patient Active Problem List   Diagnosis Date Noted   Nutcracker phenomenon of renal vein 02/06/2018   Past Medical History:  Diagnosis Date   Complication of anesthesia    woke up crying and shaking   Eczema    Irregular periods/menstrual cycles    Seasonal allergies    Tonsillar and adenoid hypertrophy 07/2016   Past Surgical History:  Procedure Laterality Date   MOUTH SURGERY     TONSILLECTOMY AND ADENOIDECTOMY Bilateral 07/11/2016   Procedure: TONSILLECTOMY AND ADENOIDECTOMY;  Surgeon: Karis Clunes, MD;  Location: [facility];  Service: ENT;  Laterality: Bilateral;   Family History  Problem Relation Age of Onset   Irritable bowel syndrome Mother    Stroke Mother    Diabetes Mother    Kidney cancer Father    Irritable bowel syndrome Paternal Grandmother    Diabetes  Other    Social History   Socioeconomic History   Marital status: Single    Spouse name: Not on file   Number of children: Not on file   Years of education: Not on file   Highest education level: Not on file  Occupational History   Not on file  Tobacco Use   Smoking status: Never    Passive exposure: Yes   Smokeless tobacco: Never   Tobacco comments:    parents smoke inside sometimes, mostly outside  Vaping Use   Vaping status: Every Day  Substance and Sexual Activity   Alcohol use: Yes    Comment: occasional   Drug use: No   Sexual activity: Not Currently  Other Topics Concern   Not on file  Social History Narrative   Not on file   Social Drivers of Health   Tobacco Use: Medium Risk (03/05/2024)   Patient History    Smoking Tobacco Use: Never    Smokeless Tobacco Use: Never    Passive Exposure: Yes  Financial Resource Strain: Low Risk (03/05/2024)   Overall Financial Resource Strain (CARDIA)    Difficulty of Paying Living Expenses: Not hard at all  Food Insecurity: No Food Insecurity (03/05/2024)   Epic    Worried About Radiation Protection Practitioner of Food in the Last Year: Never true    [employer] of Food in the Last Year: Never true  Transportation Needs: No Transportation Needs (03/05/2024)   Epic    Lack of Transportation (Medical):  No    Lack of Transportation (Non-Medical): No  Physical Activity: Inactive (03/05/2024)   Exercise Vital Sign    Days of Exercise per Week: 0 days    Minutes of Exercise per Session: 0 min  Stress: No Stress Concern Present (03/05/2024)   [employer] of Occupational Health - Occupational Stress Questionnaire    Feeling of Stress: Not at all  Social Connections: Socially Isolated (03/05/2024)   Social Connection and Isolation Panel    Frequency of Communication with Friends and Family: More than three times a week    Frequency of Social Gatherings with Friends and Family: More than three times a week    Attends Religious Services: Never     Database Administrator or Organizations: No    Attends Banker Meetings: Never    Marital Status: Never married  Intimate Partner Violence: Not At Risk (03/05/2024)   Epic    Fear of Current or Ex-Partner: No    Emotionally Abused: No    Physically Abused: No    Sexually Abused: No  Depression (PHQ2-9): Low Risk (03/05/2024)   Depression (PHQ2-9)    PHQ-2 Score: 0  Alcohol Screen: Low Risk (03/05/2024)   Alcohol Screen    Last Alcohol Screening Score (AUDIT): 2  Housing: Low Risk (03/05/2024)   Epic    Unable to Pay for Housing in the Last Year: No    Number of Times Moved in the Last Year: 0    Homeless in the Last Year: No  Utilities: Not At Risk (03/05/2024)   Epic    Threatened with loss of utilities: No  Health Literacy: Adequate Health Literacy (03/05/2024)   B1300 Health Literacy    Frequency of need for help with medical instructions: Never   Outpatient Medications Prior to Visit  Medication Sig Dispense Refill   norethindrone (MICRONOR) 0.35 MG tablet Take 1 tablet by mouth daily.     medroxyPROGESTERone  (DEPO-PROVERA ) 150 MG/ML injection Inject 150 mg into the muscle every 3 (three) months. (Patient not taking: Reported on 03/05/2024)     No facility-administered medications prior to visit.   Allergies[1]  ROS: A complete ROS was performed with pertinent positives/negatives noted in the HPI. The remainder of the ROS are negative.   Objective:   Today's Vitals   03/05/24 0911  BP: 116/75  Pulse: 72  SpO2: 100%  Weight: 175 lb 9.6 oz (79.7 kg)  Height: 5' 2 (1.575 m)    GENERAL: Well-appearing, in NAD. Well nourished. RESPIRATORY: Chest wall symmetrical. Respirations even and non-labored. Breath sounds clear to auscultation bilaterally.  CARDIAC: S1, S2 present, regular rate and rhythm without murmur or gallops. Peripheral pulses 2+ bilaterally.  MSK: Muscle tone and strength appropriate for age. Joints w/o tenderness, redness, or  swelling.  EXTREMITIES: Without clubbing, cyanosis, or edema.  NEUROLOGIC: No motor or sensory deficits. Steady, even gait. C2-C12 intact.  PSYCH/MENTAL STATUS: Alert, oriented x 3. Cooperative, appropriate mood and affect.    Health Maintenance Due  Topic Date Due   HPV VACCINES (1 - 3-dose series) Never done   HIV Screening  Never done   Hepatitis C Screening  Never done   Hepatitis B Vaccines 19-59 Average Risk (1 of 3 - 19+ 3-dose series) Never done   Cervical Cancer Screening (Pap smear)  02/09/2022   COVID-19 Vaccine (1 - 2025-26 season) Never done    No results found for any visits on 03/05/24.     Assessment & Plan:  1. Encounter  to establish care with new doctor (Primary) Discussed role of NP and expectations of the Primary Care Clinic. Discussed medical, surgical, and family history.   2. Encounter for Papanicolaou smear for cervical cancer screening Referral placed for patient. Discussed reaching out if anything concerns arise.  - Ambulatory referral to Obstetrics / Gynecology   Patient to reach out to office if new, worrisome, or unresolved symptoms arise or if no improvement in patient's condition. Patient verbalized understanding and is agreeable to treatment plan. All questions answered to patient's satisfaction.    Return in about 4 weeks (around 04/02/2024) for annual physical (fasting labs day of).    Lauraine Almarie Angus DNP, FNP-C     [1]  Allergies Allergen Reactions   Soap Rash and Other (See Comments)    EXACERBATES ECZEMA    "

## 2024-03-10 ENCOUNTER — Ambulatory Visit: Admitting: Family Medicine

## 2024-03-10 ENCOUNTER — Ambulatory Visit: Payer: Self-pay

## 2024-03-10 ENCOUNTER — Encounter: Payer: Self-pay | Admitting: Family Medicine

## 2024-03-10 VITALS — BP 100/68 | HR 100 | Temp 98.3°F | Resp 18 | Ht 62.0 in | Wt 177.0 lb

## 2024-03-10 DIAGNOSIS — H6011 Cellulitis of right external ear: Secondary | ICD-10-CM

## 2024-03-10 MED ORDER — CEPHALEXIN 500 MG PO CAPS: 500.0000 mg | ORAL_CAPSULE | Freq: Two times a day (BID) | ORAL | 0 refills | Status: AC

## 2024-03-10 NOTE — Telephone Encounter (Signed)
 FYI Only or Action Required?: FYI only for provider: appointment scheduled on 03/10/24.  Patient was last seen in primary care on 03/05/2024 by Sarah Lauraine BRAVO, FNP.  Called Nurse Triage reporting Otalgia.  Symptoms began several days ago.  Interventions attempted: OTC medications: ibuprofen and Ice/heat application.  Symptoms are: gradually worsening.  Triage Disposition: See HCP Within 4 Hours (Or PCP Triage)  Patient/caregiver understands and will follow disposition?: Yes     Copied from CRM #[id number]. Topic: Clinical - Red Word Triage >> Mar 10, 2024 10:13 AM Winona R wrote:  Pt believes her Ear pit is infected, lots of pain around ear and jaw. Pt states she has experienced this before.    Reason for Disposition  [1] SEVERE pain (e.g., excruciating) and [2] not improved 2 hours after pain medicine (e.g., acetaminophen  or ibuprofen)  Answer Assessment - Initial Assessment Questions Pt called to report R ear pain r/t suspected ear pit infection. Pt reports hx of ear pit infections but nothing that she has not been able to manage at home. Pt reports she is unable to express any fluid or pus from site; 10/10 throbbing pain around ear into jaw. Pt has applied warm compress and is taking ibuprofen for pain management. Pt denies fever, drainage, h/a, changes in hearing or balance. D/t appt availability, pt agreeable to go to sister office for eval. Appointment scheduled for evaluation. Patient agrees with plan of care, and will call back if anything changes, or if symptoms worsen.    1. LOCATION: Which ear is involved?     R ear   2. ONSET: When did the ear pain start?      2 days   3. SEVERITY: How bad is the pain?  (Scale 1-10; mild, moderate or severe)     10   4. URI SYMPTOMS: Do you have a runny nose or cough?     No   5. FEVER: Do you have a fever? If Yes, ask: What is your temperature, how was it measured, and when did it start?     No   6. CAUSE: Have  you been swimming recently?, How often do you use Q-TIPS?, Have you had any recent air travel or scuba diving?     No   7. OTHER SYMPTOMS: Do you have any other symptoms? (e.g., decreased hearing, dizziness, headache, stiff neck, vomiting)     None  Protocols used: Earache-A-AH

## 2024-03-15 NOTE — Progress Notes (Signed)
 "  Assessment & Plan Cellulitis of right ear Recurrent cellulitis with significant pain and pressure, radiating to the jawline. Current episode too painful for manual drainage. Considering surgical removal of ear pit due to recurrence and severity. - Prescribed Keflex  500 mg orally twice a day for 7 days. - Referred to ENT for evaluation and potential surgical removal of ear pit. - Advised continuation of warm compresses. Orders:   Ambulatory referral to ENT   cephALEXin  (KEFLEX ) 500 MG capsule; Take 1 capsule (500 mg total) by mouth 2 (two) times daily for 7 days.   Follow up plan: Return if symptoms worsen or fail to improve.  Niki Rung, MSN, APRN, FNP-C  Subjective:  HPI: Sarah Pierce is a 29 y.o. female presenting on 03/10/2024 for Ear Pain (Right ear pain- pressure/Started 2 days ago )  Discussed the use of AI scribe software for clinical note transcription with the patient, who gave verbal consent to proceed.  She has been experiencing severe pressure and pain in her right ear pit for the past two days, which has started to radiate down to her jawline. This condition occurs approximately twice a year, always in the same spot on her right ear, while her left ear remains unaffected.  She typically manages the condition by 'popping' the pit and cleaning it with alcohol, which usually resolves the issue. However, this time the pain is too intense to attempt this method. She has tried using warm compresses, which have been effective in the past, but they have not provided relief this time.  The last significant episode occurred in September, during which she was able to relieve the pressure by using a warm rag and eventually popping the pit. Currently, the discomfort is impacting her daily activities, such as remodeling her kitchen.  No associated symptoms such as sneezing, runny nose, or itchy, watery eyes.       ROS: Negative unless specifically indicated above in HPI.    Relevant past medical history reviewed and updated as indicated.   Allergies and medications reviewed and updated.  Current Medications[1]  Allergies[2]  Objective:   BP 100/68   Pulse 100   Temp 98.3 F (36.8 C)   Resp 18   Ht 5' 2 (1.575 m)   Wt 177 lb (80.3 kg)   LMP  (LMP Unknown) Comment: Hasn't had cycle in 10 years.  SpO2 98%   BMI 32.37 kg/m    Physical Exam Vitals reviewed.  Constitutional:      General: She is not in acute distress.    Appearance: Normal appearance. She is not ill-appearing, toxic-appearing or diaphoretic.  HENT:     Head: Normocephalic and atraumatic.     Ears:      Comments: Area of swelling and mild erythema. Eyes:     General: No scleral icterus.       Right eye: No discharge.        Left eye: No discharge.     Conjunctiva/sclera: Conjunctivae normal.  Cardiovascular:     Rate and Rhythm: Normal rate.  Pulmonary:     Effort: Pulmonary effort is normal. No respiratory distress.  Musculoskeletal:        General: Normal range of motion.     Cervical back: Normal range of motion.  Skin:    General: Skin is warm and dry.     Capillary Refill: Capillary refill takes less than 2 seconds.  Neurological:     General: No focal deficit present.  Mental Status: She is alert and oriented to person, place, and time. Mental status is at baseline.  Psychiatric:        Mood and Affect: Mood normal.        Behavior: Behavior normal.        Thought Content: Thought content normal.        Judgment: Judgment normal.            [1]  Current Outpatient Medications:    acetaminophen  (TYLENOL ) 325 MG tablet, Take 650 mg by mouth every 6 (six) hours as needed for mild pain (pain score 1-3)., Disp: , Rfl:    cephALEXin  (KEFLEX ) 500 MG capsule, Take 1 capsule (500 mg total) by mouth 2 (two) times daily for 7 days., Disp: 14 capsule, Rfl: 0   norethindrone (MICRONOR) 0.35 MG tablet, Take 1 tablet by mouth daily., Disp: , Rfl:  [2]   Allergies Allergen Reactions   Soap Rash and Other (See Comments)    EXACERBATES ECZEMA    "

## 2024-03-28 ENCOUNTER — Ambulatory Visit (INDEPENDENT_AMBULATORY_CARE_PROVIDER_SITE_OTHER): Admitting: Physician Assistant

## 2024-03-28 ENCOUNTER — Encounter (INDEPENDENT_AMBULATORY_CARE_PROVIDER_SITE_OTHER): Payer: Self-pay | Admitting: Physician Assistant

## 2024-03-28 VITALS — BP 105/73 | HR 81 | Temp 97.5°F | Ht 62.0 in | Wt 170.0 lb

## 2024-03-28 DIAGNOSIS — H6120 Impacted cerumen, unspecified ear: Secondary | ICD-10-CM

## 2024-03-28 DIAGNOSIS — Q181 Preauricular sinus and cyst: Secondary | ICD-10-CM | POA: Diagnosis not present

## 2024-03-28 NOTE — Progress Notes (Signed)
 Dear Dr. Merlynn, Here is my assessment for our mutual patient, Sarah Pierce. Thank you for allowing me the opportunity to care for your patient. Please do not hesitate to contact me should you have any other questions. Sincerely, Chyrl Cohen PA-C  Otolaryngology Clinic Note Referring provider: Dr. Merlynn HPI:  Sarah Pierce is a 30 y.o. female kindly referred by Dr. Merlynn   Discussed the use of AI scribe software for clinical note transcription with the patient, who gave verbal consent to proceed.  History of Present Illness   Sarah Pierce is a 29 year old female with bilateral preauricular pits who presents for evaluation of recurrent left preauricular pit infections.  She has had bilateral preauricular pits since birth, with the left side becoming symptomatic in 2018. Since then, the left preauricular pit has developed infections approximately twice per year. These episodes are typically managed at home by expressing the contents, cleaning the area, and applying alcohol, which usually leads to resolution of symptoms. The right preauricular pit has remained asymptomatic.  Her most recent left preauricular pit infection began on March 08, 2024, and was more severe than prior episodes. She experienced significant pain, with allodynia and severe discomfort described as making her want to just scream. The infection was associated with marked erythema and swelling extending to involve half of her cheek. She was treated with antibiotics, resulting in gradual improvement. Since resolution of the infection, she has had persistent pruritus at the site.  She has never undergone surgical intervention for the preauricular pits. She denies hearing loss, otorrhea, or otitis media. No issues have occurred with the right preauricular pit.           Independent Review of Additional Tests or Records:  PCP office visit note 03/05/2024   PMH/Meds/All/SocHx/FamHx/ROS:   Past Medical History:   Diagnosis Date   Complication of anesthesia    woke up crying and shaking   Eczema    Irregular periods/menstrual cycles    Seasonal allergies    Tonsillar and adenoid hypertrophy 07/2016     Past Surgical History:  Procedure Laterality Date   MOUTH SURGERY     TONSILLECTOMY AND ADENOIDECTOMY Bilateral 07/11/2016   Procedure: TONSILLECTOMY AND ADENOIDECTOMY;  Surgeon: Karis Clunes, MD;  Location: [facility];  Service: ENT;  Laterality: Bilateral;    Family History  Problem Relation Age of Onset   Irritable bowel syndrome Mother    Stroke Mother    Diabetes Mother    Kidney cancer Father    Irritable bowel syndrome Paternal Grandmother    Diabetes Other      Social Connections: Socially Isolated (03/05/2024)   Social Connection and Isolation Panel    Frequency of Communication with Friends and Family: More than three times a week    Frequency of Social Gatherings with Friends and Family: More than three times a week    Attends Religious Services: Never    Database Administrator or Organizations: No    Attends Engineer, Structural: Never    Marital Status: Never married     Current Medications[1]   Physical Exam:   BP 105/73   Pulse 81   Temp (!) 97.5 F (36.4 C)   Ht 5' 2 (1.575 m)   Wt 170 lb (77.1 kg)   LMP  (LMP Unknown) Comment: Hasn't had cycle in 10 years.  SpO2 98%   BMI 31.09 kg/m   Pertinent Findings  CN II-XII grossly intact Bilateral EAC clear and TM  intact with well pneumatized middle ear spaces, preauricular pit noted on photos bilateral right is larger than left no surrounding redness erythema or fluctuance Anterior rhinoscopy: Septum midline; bilateral inferior turbinates with no hypertrophy No lesions of oral cavity/oropharynx; dentition within normal limits No obviously palpable neck masses/lymphadenopathy/thyromegaly No respiratory distress or stridor      Seprately Identifiable Procedures:  None  Impression & Plans:   Sarah Pierce is a 29 y.o. female with the following   Assessment and Plan    Preauricular sinus with recurrent infection Bilateral preauricular pits with recurrent left-sided infections. Surgical excision considered due to frequency and severity, though complete removal is challenging with possible recurrence. - Sent message to otolaryngology surgeons for evaluation and potential surgical excision. - Discussed surgical excision, including challenges and recurrence risk. - Reviewed photos confirming improvement post-antibiotic therapy. - Advised to notify clinic if pain or infection recurs.  Impacted cerumen Impacted cerumen causing pruritus without hearing loss or otitis. - Removed impacted cerumen. - Advised to notify clinic if pain or symptoms persist.           - f/u Discuss with surgeons then let patient know plan    Thank you for allowing me the opportunity to care for your patient. Please do not hesitate to contact me should you have any other questions.  Sincerely, Chyrl Cohen PA-C [facility] Phone: [phone] Fax: [phone]  03/28/2024, 10:40 AM        [1]  Current Outpatient Medications:    acetaminophen  (TYLENOL ) 325 MG tablet, Take 650 mg by mouth every 6 (six) hours as needed for mild pain (pain score 1-3)., Disp: , Rfl:    norethindrone (MICRONOR) 0.35 MG tablet, Take 1 tablet by mouth daily., Disp: , Rfl:

## 2024-04-02 ENCOUNTER — Encounter (HOSPITAL_BASED_OUTPATIENT_CLINIC_OR_DEPARTMENT_OTHER): Payer: Self-pay

## 2024-04-02 ENCOUNTER — Ambulatory Visit (INDEPENDENT_AMBULATORY_CARE_PROVIDER_SITE_OTHER)

## 2024-04-02 VITALS — BP 113/76 | HR 83 | Ht 62.0 in | Wt 179.2 lb

## 2024-04-02 DIAGNOSIS — L308 Other specified dermatitis: Secondary | ICD-10-CM | POA: Insufficient documentation

## 2024-04-02 DIAGNOSIS — I871 Compression of vein: Secondary | ICD-10-CM | POA: Diagnosis not present

## 2024-04-02 DIAGNOSIS — Z0000 Encounter for general adult medical examination without abnormal findings: Secondary | ICD-10-CM

## 2024-04-02 DIAGNOSIS — L2084 Intrinsic (allergic) eczema: Secondary | ICD-10-CM | POA: Insufficient documentation

## 2024-04-02 DIAGNOSIS — Z124 Encounter for screening for malignant neoplasm of cervix: Secondary | ICD-10-CM | POA: Diagnosis not present

## 2024-04-02 NOTE — Progress Notes (Signed)
 "  Subjective:   Sarah Pierce [birth year]  04/02/2024   CC: Chief Complaint  Patient presents with   Annual Exam    Patient is here for her annual physical. Denies any concerns for today's visit.     HPI: Sarah Pierce is a 29 y.o. female who presents for a routine health maintenance exam.  Labs collected at time of visit.   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 Was seen by specialists within [facility] and [facility]. Patient states she was told if she notices protein in her urine to get a repeat scan. Patient unsure how she will know if she has protein in her urine on her own. Denies any acute concerns such as hematuria or flank pain. 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  Irregular bowel movements: States she has been seen by GI for this problem before and was told to take daily Miralax. Patient states sometimes she will eat a meal and immediately have to go to the bathroom while other times she will go days without having a bowel movement. States she is not currently taking the miralax as she felt that did not help. Denies any blood in her stool. Denies any family history of colon cancer. Does report familial history of IBS.   HEALTH SCREENINGS: - Vision Screening: up to date - Dental Visits: up to date - Pap smear: referral placed - Breast Exam: referral placed - STD Screening: Declined - Mammogram (40+): Not applicable  - Colonoscopy (45+): Not applicable  - Bone Density (65+ or under 65 with predisposing conditions): Not applicable  - Lung CA screening with low-dose CT:  Not applicable Adults age 76-80 who are current cigarette smokers or quit within the last 15 years. Must have 20 pack year history.   Depression and Anxiety Screen done today and results listed below:     03/10/2024    1:30 PM 03/05/2024    9:17 AM  Depression screen PHQ 2/9  Decreased Interest 0  0  Down, Depressed, Hopeless 0 0  PHQ - 2 Score 0 0  Altered sleeping  0  Tired, decreased energy  0  Change in appetite  0  Feeling bad or failure about yourself   0  Trouble concentrating  0  Moving slowly or fidgety/restless  0  Suicidal thoughts  0  PHQ-9 Score  0  Difficult doing work/chores  Not difficult at all      03/05/2024    9:17 AM  GAD 7 : Generalized Anxiety Score  Nervous, Anxious, on Edge 0   Control/stop worrying 0   Worry too much - different things 0   Trouble relaxing 0   Restless 0   Easily annoyed or irritable 0   Afraid - awful might happen 0   Total GAD 7 Score 0  Anxiety Difficulty Not difficult at all     Data saved with a previous flowsheet row definition    IMMUNIZATIONS: - Tdap: Tetanus vaccination status reviewed: last tetanus booster within 10 years. - HPV: Up to date - Influenza: Refused - Pneumovax: Not applicable - Prevnar 20: Not applicable - Shingrix (50+): Not applicable   Past medical history, surgical history, medications, allergies, family history and social history reviewed with patient today and changes made to appropriate areas of the chart.   Past Medical History:  Diagnosis Date   Complication of anesthesia  woke up crying and shaking   Eczema    Irregular periods/menstrual cycles    Seasonal allergies    Tonsillar and adenoid hypertrophy 07/2016    Past Surgical History:  Procedure Laterality Date   MOUTH SURGERY     TONSILLECTOMY AND ADENOIDECTOMY Bilateral 07/11/2016   Procedure: TONSILLECTOMY AND ADENOIDECTOMY;  Surgeon: Karis Clunes, MD;  Location: [facility];  Service: ENT;  Laterality: Bilateral;    Current Outpatient Medications on File Prior to Visit  Medication Sig   acetaminophen  (TYLENOL ) 325 MG tablet Take 650 mg by mouth every 6 (six) hours as needed for mild pain (pain score 1-3).   norethindrone (MICRONOR) 0.35 MG tablet Take 1 tablet by mouth daily.   No current  facility-administered medications on file prior to visit.    Allergies[1]   Social History   Socioeconomic History   Marital status: Single    Spouse name: Not on file   Number of children: Not on file   Years of education: Not on file   Highest education level: Not on file  Occupational History   Not on file  Tobacco Use   Smoking status: Never    Passive exposure: Yes   Smokeless tobacco: Never   Tobacco comments:    parents smoke inside sometimes, mostly outside. Patient has been vaping since 2020.   Vaping Use   Vaping status: Every Day  Substance and Sexual Activity   Alcohol use: Yes    Comment: occasional   Drug use: No   Sexual activity: Not Currently  Other Topics Concern   Not on file  Social History Narrative   Not on file   Social Drivers of Health   Tobacco Use: Medium Risk (04/02/2024)   Patient History    Smoking Tobacco Use: Never    Smokeless Tobacco Use: Never    Passive Exposure: Yes  Financial Resource Strain: Low Risk (03/05/2024)   Overall Financial Resource Strain (CARDIA)    Difficulty of Paying Living Expenses: Not hard at all  Food Insecurity: No Food Insecurity (03/05/2024)   Epic    Worried About Programme Researcher, Broadcasting/film/video in the Last Year: Never true    Ran Out of Food in the Last Year: Never true  Transportation Needs: No Transportation Needs (03/05/2024)   Epic    Lack of Transportation (Medical): No    Lack of Transportation (Non-Medical): No  Physical Activity: Inactive (03/05/2024)   Exercise Vital Sign    Days of Exercise per Week: 0 days    Minutes of Exercise per Session: 0 min  Stress: No Stress Concern Present (03/05/2024)   [employer] of Occupational Health - Occupational Stress Questionnaire    Feeling of Stress: Not at all  Social Connections: Socially Isolated (03/05/2024)   Social Connection and Isolation Panel    Frequency of Communication with Friends and Family: More than three times a week    Frequency of  Social Gatherings with Friends and Family: More than three times a week    Attends Religious Services: Never    Database Administrator or Organizations: No    Attends Banker Meetings: Never    Marital Status: Never married  Intimate Partner Violence: Not At Risk (03/05/2024)   Epic    Fear of Current or Ex-Partner: No    Emotionally Abused: No    Physically Abused: No    Sexually Abused: No  Depression (PHQ2-9): Low Risk (03/10/2024)   Depression (PHQ2-9)  PHQ-2 Score: 0  Alcohol Screen: Low Risk (03/05/2024)   Alcohol Screen    Last Alcohol Screening Score (AUDIT): 2  Housing: Low Risk (03/05/2024)   Epic    Unable to Pay for Housing in the Last Year: No    Number of Times Moved in the Last Year: 0    Homeless in the Last Year: No  Utilities: Not At Risk (03/05/2024)   Epic    Threatened with loss of utilities: No  Health Literacy: Adequate Health Literacy (03/05/2024)   B1300 Health Literacy    Frequency of need for help with medical instructions: Never   Tobacco Use History[2] Social History   Substance and Sexual Activity  Alcohol Use Yes   Comment: occasional    Family History  Problem Relation Age of Onset   Irritable bowel syndrome Mother    Stroke Mother    Diabetes Mother    Kidney cancer Father    Irritable bowel syndrome Paternal Grandmother    Diabetes Other      ROS: Denies fever, fatigue, unexplained weight loss/gain, chest pain, SHOB, and palpitations. Denies neurological deficits, gastrointestinal or genitourinary complaints, and skin changes.   Objective:   Today's Vitals   04/02/24 0904  BP: 113/76  Pulse: 83  SpO2: 100%  Weight: 179 lb 3.2 oz (81.3 kg)  Height: 5' 2 (1.575 m)    GENERAL APPEARANCE: Well-appearing, in NAD. Well nourished. SKIN: Pink, warm and dry. Turgor normal. No rash, lesion, ulceration, or ecchymoses. Hair evenly distributed.  HEENT: HEAD: Normocephalic.  EYES: PERRLA. EOMI. Lids intact w/o defect.  Sclera white, Conjunctiva pink w/o exudate.  EARS: External ear w/o redness, swelling, masses or lesions. EAC clear. TM's intact, translucent w/o bulging, appropriate landmarks visualized. Appropriate acuity to conversational tones.  NOSE: Septum midline w/o deformity. Nares patent, mucosa pink and non-inflamed w/o drainage. No sinus tenderness.  THROAT: Uvula midline. Oropharynx clear. Tonsils non-inflamed w/o exudate. Oral mucosa pink and moist.  NECK: Supple, Trachea midline. Full ROM w/o pain or tenderness. No lymphadenopathy. Thyroid non-tender w/o enlargement or palpable masses. BREASTS: pt deferred exam RESPIRATORY: Chest wall symmetrical w/o masses. Respirations even and non-labored. Breath sounds clear to auscultation bilaterally. No wheezes, rales, rhonchi, or crackles. CARDIAC: S1, S2 present, regular rate and rhythm. No gallops, murmurs, rubs, or clicks. PMI w/o lifts, heaves, or thrills. No carotid bruits. Capillary refill <2 seconds. Peripheral pulses 2+ bilaterally. GI: Abdomen soft w/o distention. Normoactive bowel sounds. No palpable masses or tenderness. No guarding or rebound tenderness. Liver and spleen w/o tenderness or enlargement. No CVA tenderness.  GU: pt deferred exam MSK: Muscle tone and strength appropriate for age, w/o atrophy or abnormal movement.  EXTREMITIES: Active ROM intact, w/o tenderness, crepitus, or contracture. No obvious joint deformities or effusions. No clubbing, edema, or cyanosis.  NEUROLOGIC: CN's II-XII intact. Motor strength symmetrical with no obvious weakness. No sensory deficits. DTR's 2+ symmetric bilaterally. Steady, even gait.  PSYCH/MENTAL STATUS: Alert, oriented x 3. Cooperative, appropriate mood and affect     Assessment & Plan:  1. Annual physical exam (Primary) Discussed preventative screenings, vaccines, lab work, and healthy lifestyle with patient.  - CBC with Differential/Platelet - Comprehensive metabolic panel with GFR - Lipid  panel - TSH - Hemoglobin A1c  2. Encounter for Papanicolaou smear for cervical cancer screening Placing a second referral per patient request as she did not hear back from first referral. Patient would like to get back on the Depo shots in which she is aware she  will need to be seen by OBGYN for long term management of this. Current on OC and doing well. Will continue regimen until followed by OBGYN. - Ambulatory referral to Obstetrics / Gynecology  3. Nutcracker phenomenon of renal vein Obtaining urine today to check for protein or hematuria. Discussed potential follow up with vascular or needing repeat US /CT depending on urinalysis. Discussed red flag symptoms and when to be seen in ER. - Urinalysis, Routine w reflex microscopic   PATIENT COUNSELING:  - Encouraged a healthy well-balanced diet. Patient may adjust caloric intake to maintain or achieve ideal body weight. May reduce intake of dietary saturated fat and total fat and have adequate dietary potassium and calcium preferably from fresh fruits, vegetables, and low-fat dairy products.   - Advised to avoid cigarette smoking. - Discussed with the patient that most people either abstain from alcohol or drink within safe limits (<=14/week and <=4 drinks/occasion for males, <=7/weeks and <= 3 drinks/occasion for females) and that the risk for alcohol disorders and other health effects rises proportionally with the number of drinks per week and how often a drinker exceeds daily limits. - Discussed cessation/primary prevention of drug use and availability of treatment for abuse.  - Discussed sexually transmitted diseases, avoidance of unintended pregnancy and contraceptive alternatives.  - Stressed the importance of regular exercise - Injury prevention: Discussed safety belts, safety helmets, smoke detector, smoking near bedding or upholstery.  - Dental health: Discussed importance of regular tooth brushing, flossing, and dental visits.    NEXT PREVENTATIVE PHYSICAL DUE IN 1 YEAR.  Return in about 4 months (around 07/31/2024) for follow up urine.  Patient to reach out to office if new, worrisome, or unresolved symptoms arise or if no improvement in patient's condition. Patient verbalized understanding and is agreeable to treatment plan. All questions answered to patient's satisfaction.    Lauraine Almarie Angus DNP, FNP-C     [1]  Allergies Allergen Reactions   Soap Rash and Other (See Comments)    EXACERBATES ECZEMA   [2]  Social History Tobacco Use  Smoking Status Never   Passive exposure: Yes  Smokeless Tobacco Never  Tobacco Comments   parents smoke inside sometimes, mostly outside. Patient has been vaping since 2020.    "

## 2024-04-03 ENCOUNTER — Other Ambulatory Visit: Payer: Self-pay

## 2024-04-03 ENCOUNTER — Ambulatory Visit (HOSPITAL_BASED_OUTPATIENT_CLINIC_OR_DEPARTMENT_OTHER): Payer: Self-pay

## 2024-04-03 DIAGNOSIS — I871 Compression of vein: Secondary | ICD-10-CM

## 2024-04-03 LAB — COMPREHENSIVE METABOLIC PANEL WITH GFR
ALT: 14 [IU]/L (ref 0–32)
AST: 17 [IU]/L (ref 0–40)
Albumin: 4.7 g/dL (ref 4.0–5.0)
Alkaline Phosphatase: 89 [IU]/L (ref 41–116)
BUN/Creatinine Ratio: 16 (ref 9–23)
BUN: 13 mg/dL (ref 6–20)
Bilirubin Total: 0.4 mg/dL (ref 0.0–1.2)
CO2: 18 mmol/L — ABNORMAL LOW (ref 20–29)
Calcium: 10.2 mg/dL (ref 8.7–10.2)
Chloride: 103 mmol/L (ref 96–106)
Creatinine, Ser: 0.8 mg/dL (ref 0.57–1.00)
Globulin, Total: 2.6 g/dL (ref 1.5–4.5)
Glucose: 86 mg/dL (ref 70–99)
Potassium: 4.8 mmol/L (ref 3.5–5.2)
Sodium: 141 mmol/L (ref 134–144)
Total Protein: 7.3 g/dL (ref 6.0–8.5)
eGFR: 104 mL/min/{1.73_m2}

## 2024-04-03 LAB — LIPID PANEL
Chol/HDL Ratio: 3.5 ratio (ref 0.0–4.4)
Cholesterol, Total: 152 mg/dL (ref 100–199)
HDL: 44 mg/dL
LDL Chol Calc (NIH): 90 mg/dL (ref 0–99)
Triglycerides: 96 mg/dL (ref 0–149)
VLDL Cholesterol Cal: 18 mg/dL (ref 5–40)

## 2024-04-03 LAB — CBC WITH DIFFERENTIAL/PLATELET
Basophils Absolute: 0 10*3/uL (ref 0.0–0.2)
Basos: 0 %
EOS (ABSOLUTE): 0.1 10*3/uL (ref 0.0–0.4)
Eos: 1 %
Hematocrit: 44 % (ref 34.0–46.6)
Hemoglobin: 14.8 g/dL (ref 11.1–15.9)
Immature Grans (Abs): 0 10*3/uL (ref 0.0–0.1)
Immature Granulocytes: 0 %
Lymphocytes Absolute: 2.6 10*3/uL (ref 0.7–3.1)
Lymphs: 32 %
MCH: 30.8 pg (ref 26.6–33.0)
MCHC: 33.6 g/dL (ref 31.5–35.7)
MCV: 92 fL (ref 79–97)
Monocytes Absolute: 0.4 10*3/uL (ref 0.1–0.9)
Monocytes: 6 %
Neutrophils Absolute: 4.9 10*3/uL (ref 1.4–7.0)
Neutrophils: 61 %
Platelets: 342 10*3/uL (ref 150–450)
RBC: 4.81 x10E6/uL (ref 3.77–5.28)
RDW: 11.6 % — ABNORMAL LOW (ref 11.7–15.4)
WBC: 8 10*3/uL (ref 3.4–10.8)

## 2024-04-03 LAB — URINALYSIS, ROUTINE W REFLEX MICROSCOPIC
Bilirubin, UA: NEGATIVE
Glucose, UA: NEGATIVE
Ketones, UA: NEGATIVE
Nitrite, UA: NEGATIVE
Protein,UA: NEGATIVE
RBC, UA: NEGATIVE
Specific Gravity, UA: 1.022 (ref 1.005–1.030)
Urobilinogen, Ur: 0.2 mg/dL (ref 0.2–1.0)
pH, UA: 5.5 (ref 5.0–7.5)

## 2024-04-03 LAB — MICROSCOPIC EXAMINATION: Casts: NONE SEEN /LPF

## 2024-04-03 LAB — HEMOGLOBIN A1C
Est. average glucose Bld gHb Est-mCnc: 103 mg/dL
Hgb A1c MFr Bld: 5.2 % (ref 4.8–5.6)

## 2024-04-03 LAB — TSH: TSH: 2.08 u[IU]/mL (ref 0.450–4.500)

## 2024-08-01 ENCOUNTER — Ambulatory Visit (HOSPITAL_BASED_OUTPATIENT_CLINIC_OR_DEPARTMENT_OTHER)
